# Patient Record
Sex: Female | Born: 2000 | Race: White | Hispanic: No | Marital: Single | Smoking: Former smoker
Health system: Southern US, Community
[De-identification: ages and names within clinical notes are randomized; demographics above are authoritative.]

## PROBLEM LIST (undated history)

## (undated) ENCOUNTER — Ambulatory Visit: Source: Home / Self Care

---
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
## 2019-05-19 ENCOUNTER — Other Ambulatory Visit: Payer: Self-pay

## 2019-05-19 DIAGNOSIS — Z20822 Contact with and (suspected) exposure to covid-19: Secondary | ICD-10-CM

## 2019-05-22 LAB — NOVEL CORONAVIRUS, NAA: SARS-CoV-2, NAA: NOT DETECTED

## 2019-08-03 ENCOUNTER — Ambulatory Visit: Payer: Medicaid Other | Attending: Internal Medicine

## 2019-08-03 ENCOUNTER — Other Ambulatory Visit: Payer: Medicaid Other

## 2019-08-03 DIAGNOSIS — Z20822 Contact with and (suspected) exposure to covid-19: Secondary | ICD-10-CM

## 2019-08-04 LAB — NOVEL CORONAVIRUS, NAA: SARS-CoV-2, NAA: DETECTED — AB

## 2019-08-08 ENCOUNTER — Ambulatory Visit: Payer: Self-pay | Admitting: *Deleted

## 2019-08-08 NOTE — Telephone Encounter (Signed)
Pt reports tested positive covid 08/03/2019. Reports constant chest tightness with occasional pain upper chest between breasts. States "Feels like I'm not taking a deep enough breath when I breathe in." Afebrile. Has cough.No PCP.  Advised UC. Advised to call UC of choice before going to alert them of positive status. Pt verbalizes understanding.   Reason for Disposition . Taking a deep breath makes pain worse  Answer Assessment - Initial Assessment Questions 1. LOCATION: "Where does it hurt?"      Upper chest between breasts 2. RADIATION: "Does the pain go anywhere else?" (e.g., into neck, jaw, arms, back)     no 3. ONSET: "When did the chest pain begin?" (Minutes, hours or days)       4. PATTERN "Does the pain come and go, or has it been constant since it started?"  "Does it get worse with exertion?"      Tightness constant 5. DURATION: "How long does it last" (e.g., seconds, minutes, hours)     Pain is intermittent, seconds 6. SEVERITY: "How bad is the pain?"  (e.g., Scale 1-10; mild, moderate, or severe)    - MILD (1-3): doesn't interfere with normal activities     - MODERATE (4-7): interferes with normal activities or awakens from sleep    - SEVERE (8-10): excruciating pain, unable to do any normal activities       Moderate tightness 7. CARDIAC RISK FACTORS: "Do you have any history of heart problems or risk factors for heart disease?" (e.g., angina, prior heart attack; diabetes, high blood pressure, high cholesterol, smoker, or strong family history of heart disease)      8. PULMONARY RISK FACTORS: "Do you have any history of lung disease?"  (e.g., blood clots in lung, asthma, emphysema, birth control pills)      9. CAUSE: "What do you think is causing the chest pain?" Covid positive\    10. OTHER SYMPTOMS: "Do you have any other symptoms?" (e.g., dizziness, nausea, vomiting, sweating, fever, difficulty breathing, cough)       cough  Protocols used: CHEST PAIN-A-AH

## 2019-10-21 ENCOUNTER — Ambulatory Visit: Payer: Medicaid Other | Attending: Internal Medicine

## 2019-10-21 DIAGNOSIS — Z23 Encounter for immunization: Secondary | ICD-10-CM

## 2019-10-21 NOTE — Progress Notes (Signed)
   Covid-19 Vaccination Clinic  Name:  Kelli Hunter    MRN: [id number] DOB: [birth year]  10/21/2019  [name] was observed post Covid-19 immunization for 15 minutes without incident. She was provided with Vaccine Information Sheet and instruction to access the V-Safe system.   [name] was instructed to call 911 with any severe reactions post vaccine: Marland Kitchen Difficulty breathing  . Swelling of face and throat  . A fast heartbeat  . A bad rash all over body  . Dizziness and weakness   Immunizations Administered    Name Date Dose VIS Date Route   Pfizer COVID-19 Vaccine 10/21/2019 12:24 PM 0.3 mL 08/24/2018 Intramuscular   Manufacturer: [employer], [employer]   Lot: [id number]   NDC: [address]-5

## 2019-11-14 ENCOUNTER — Ambulatory Visit: Payer: Medicaid Other | Attending: Internal Medicine

## 2019-11-14 DIAGNOSIS — Z23 Encounter for immunization: Secondary | ICD-10-CM

## 2019-11-14 NOTE — Progress Notes (Signed)
   Covid-19 Vaccination Clinic  Name:  Kelli Hunter    MRN: [id number] DOB: [birth year]  11/14/2019  Kelli Hunter was observed post Covid-19 immunization for 15 minutes without incident. She was provided with Vaccine Information Sheet and instruction to access the V-Safe system.   Kelli Hunter was instructed to call 911 with any severe reactions post vaccine: Marland Kitchen Difficulty breathing  . Swelling of face and throat  . A fast heartbeat  . A bad rash all over body  . Dizziness and weakness   Immunizations Administered    Name Date Dose VIS Date Route   Pfizer COVID-19 Vaccine 11/14/2019 12:15 PM 0.3 mL 08/24/2018 Intramuscular   Manufacturer: [employer], [employer]   Lot: [id number]   NDC: [address]-9

## 2020-11-11 ENCOUNTER — Ambulatory Visit (HOSPITAL_COMMUNITY)
Admission: EM | Admit: 2020-11-11 | Discharge: 2020-11-11 | Disposition: A | Discharge status: Home / Self Care | Payer: Medicaid Other | Attending: Medical Oncology | Admitting: Medical Oncology

## 2020-11-11 ENCOUNTER — Other Ambulatory Visit: Payer: Self-pay

## 2020-11-11 ENCOUNTER — Encounter (HOSPITAL_COMMUNITY): Payer: Self-pay

## 2020-11-11 ENCOUNTER — Ambulatory Visit (INDEPENDENT_AMBULATORY_CARE_PROVIDER_SITE_OTHER): Payer: Medicaid Other

## 2020-11-11 DIAGNOSIS — R0602 Shortness of breath: Secondary | ICD-10-CM | POA: Diagnosis not present

## 2020-11-11 DIAGNOSIS — U099 Post covid-19 condition, unspecified: Secondary | ICD-10-CM | POA: Diagnosis not present

## 2020-11-11 DIAGNOSIS — Z8616 Personal history of COVID-19: Secondary | ICD-10-CM | POA: Diagnosis not present

## 2020-11-11 NOTE — ED Triage Notes (Signed)
Pt presents with shortness of breath post covid; pt states she recovered from covid about 2 weeks ago for the 3 rd time and has some difficulties getting a full breath.

## 2020-11-11 NOTE — Discharge Instructions (Addendum)
I would talk to your PCP about a referral to a pulmonologist as you may benefit from treatment and physical therapy related to shortness of breathe secondary to COVID-19.

## 2020-11-11 NOTE — ED Provider Notes (Signed)
[facility]    CSN: [id number] Arrival date & time: 11/11/20  1414      History   Chief Complaint Chief Complaint  Patient presents with  . Shortness of Breath    HPI Kelli Hunter is a 20 y.o. female.   HPI   Shortness of breath: Patient was diagnosed with COVID 2 weeks ago for the third time.  She says progressively with each COVID case she has had more more trouble with shortness of breath.  She states that her shortness of breath has not significantly worsened during the course of this COVID outbreak.  She continues to struggle with doing things she normally used to be able to complete.  Still has having low-grade fevers off and on. Occasional chest tightness at rest but no chest pain.  She states that she has tried pretty much every medication such as steroids, inhalers, Tessalon, syrups both prescription and over-the-counter with little relief.   History reviewed. No pertinent past medical history.  There are no problems to display for this patient.   History reviewed. No pertinent surgical history.  OB History   No obstetric history on file.      Home Medications    Prior to Admission medications   Medication Sig Start Date End Date Taking? Authorizing Provider  cyclobenzaprine (FLEXERIL) 5 MG tablet Take 5 mg by mouth daily as needed for muscle spasms.    [provider]  ibuprofen (ADVIL,MOTRIN) 600 MG tablet Take 1 tablet (600 mg total) by mouth every 6 (six) hours as needed. 09/05/18   Nira Conn, MD  naproxen (NAPROSYN) 500 MG tablet Take 500 mg by mouth daily as needed for mild pain.    [provider]    Family History Family History  Family history unknown: Yes    Social History Social History   Tobacco Use  . Smoking status: Former Games developer  . Smokeless tobacco: Never Used  Vaping Use  . Vaping Use: Some days  . Substances: Nicotine  Substance Use Topics  . Alcohol use: No    Alcohol/week: 0.0 standard  drinks  . Drug use: No     Allergies   Patient has no known allergies.   Review of Systems Review of Systems  As stated above in HPI Physical Exam Triage Vital Signs ED Triage Vitals  Enc Vitals Group     BP 11/11/20 1453 129/70     Pulse Rate 11/11/20 1453 87     Resp 11/11/20 1453 17     Temp 11/11/20 1453 99.6 F (37.6 C)     Temp Source 11/11/20 1453 Oral     SpO2 11/11/20 1453 100 %     Weight --      Height --      Head Circumference --      Peak Flow --      Pain Score 11/11/20 1454 0     Pain Loc --      Pain Edu? --      Excl. in GC? --    No data found.  Updated Vital Signs BP 129/70 (BP Location: Right Arm)   Pulse 87   Temp 99.6 F (37.6 C) (Oral)   Resp 17   LMP 10/14/2020   SpO2 100%   Physical Exam Vitals and nursing note reviewed.  Constitutional:      General: She is not in acute distress.    Appearance: She is well-developed. She is not ill-appearing, toxic-appearing or diaphoretic.  Comments: Pt is able to speak in full sentences.   HENT:     Head: Normocephalic and atraumatic.     Mouth/Throat:     Mouth: Mucous membranes are moist.     Pharynx: Oropharynx is clear. No pharyngeal swelling or oropharyngeal exudate.  Eyes:     Extraocular Movements: Extraocular movements intact.     Pupils: Pupils are equal, round, and reactive to light.  Neck:     Thyroid: No thyromegaly.     Trachea: No tracheal deviation.  Cardiovascular:     Rate and Rhythm: Normal rate and regular rhythm.  Pulmonary:     Effort: Pulmonary effort is normal.     Breath sounds: Normal breath sounds. No decreased breath sounds, wheezing, rhonchi or rales.  Chest:     Chest wall: Tenderness (reproducible mid chest tightness to palpation of patient by patient ) present. No mass.  Musculoskeletal:     Cervical back: Normal range of motion and neck supple.  Skin:    General: Skin is warm.     Capillary Refill: Capillary refill takes less than 2 seconds.      Coloration: Skin is not cyanotic or pale.     Findings: No rash.  Neurological:     Mental Status: She is alert and oriented to person, place, and time.     Motor: No weakness.      UC Treatments / Results  Labs (all labs ordered are listed, but only abnormal results are displayed) Labs Reviewed - No data to display  EKG   Radiology No results found.  Procedures Procedures (including critical care time)  Medications Ordered in UC Medications - No data to display  Initial Impression / Assessment and Plan / UC Course  I have reviewed the triage vital signs and the nursing notes.  Pertinent labs & imaging results that were available during my care of the patient were reviewed by me and considered in my medical decision making (see chart for details).     New.  Chest x-ray is benign appearing.  EKG is reassuring.  Will obtain labs however I strongly suspect that this may be shortness of breath secondary to COVID-19.  I discussed with her that I have had a quite a few patients who have been seen by pulmonology following COVID-19 with having reassuring vitals, cardiac, and pulmonary work-up was the end up needing physical therapy related to shortness of breath.  Of course we always discussed that should she have any chest pain or calf pain especially with activity she will need further work-up in the emergency room to rule out PE or MI.  For her reproducible pain I have recommended naproxen twice daily x7 days.   Final Clinical Impressions(s) / UC Diagnoses   Final diagnoses:  None   Discharge Instructions   None    ED Prescriptions    None     PDMP not reviewed this encounter.   Rushie Chestnut, [place] 11/11/20 1653

## 2020-11-16 IMAGING — US US RENAL
1 series · 14 of 25 positions shown · non-contrast
Comparison: None.

CLINICAL DATA: Evaluate for kidney stone.

EXAM:
RENAL / URINARY TRACT ULTRASOUND COMPLETE

[Series 1: us renal · 14 of 30 slices shown]
[im 1/30]
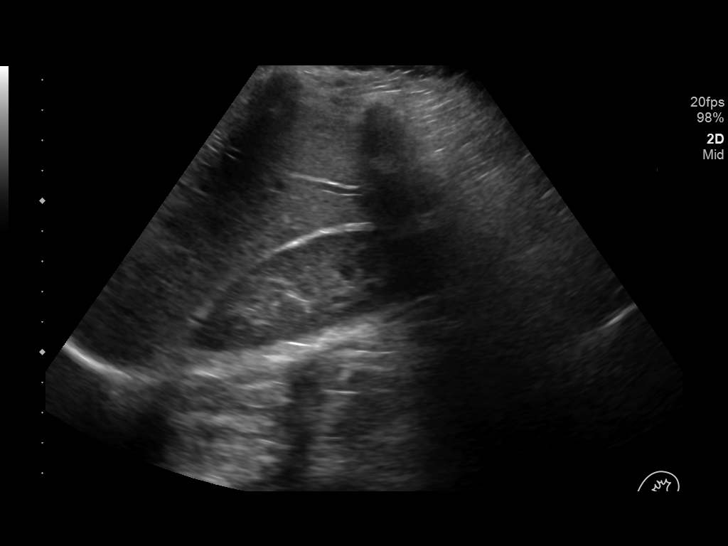
[im 3/30]
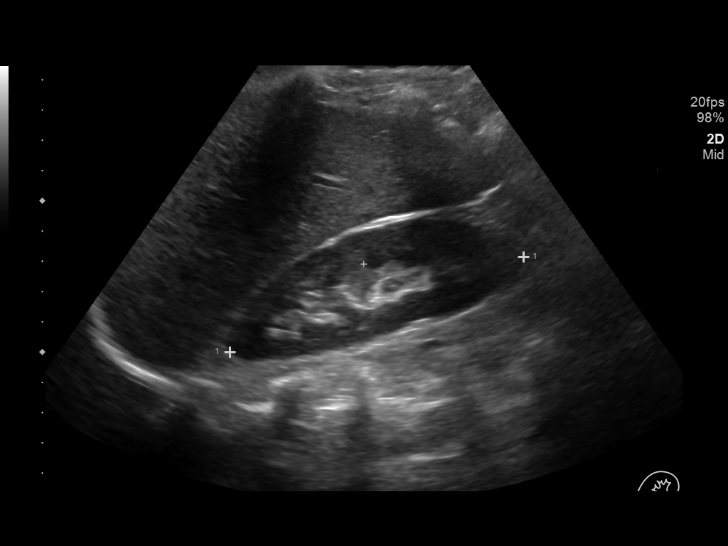
[im 5/30]
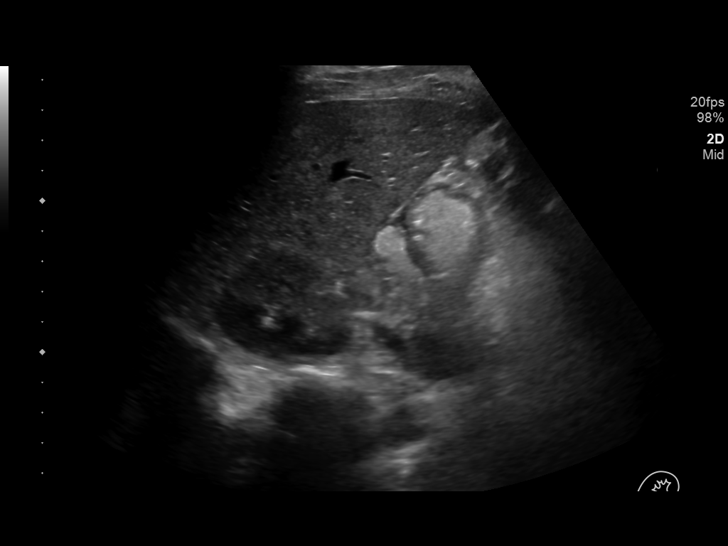
[im 8/30]
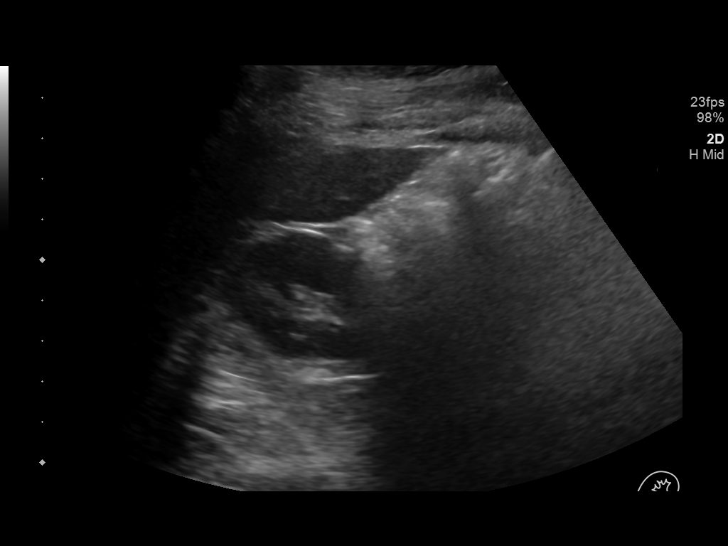
[im 10/30]
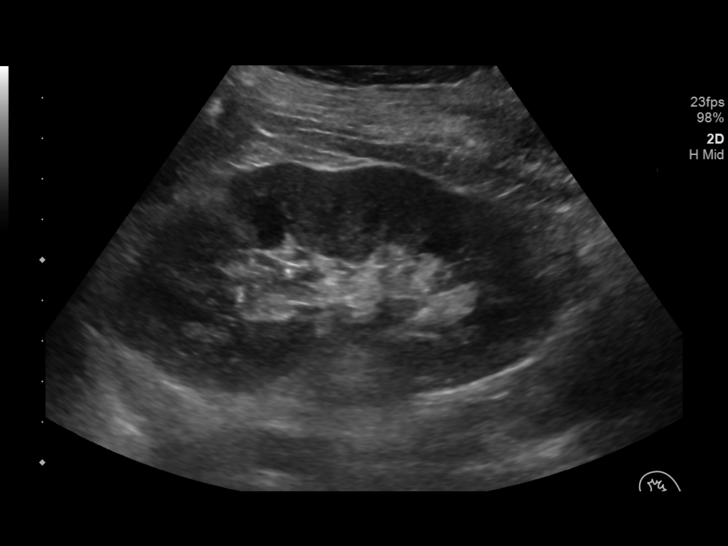
[im 11/30]
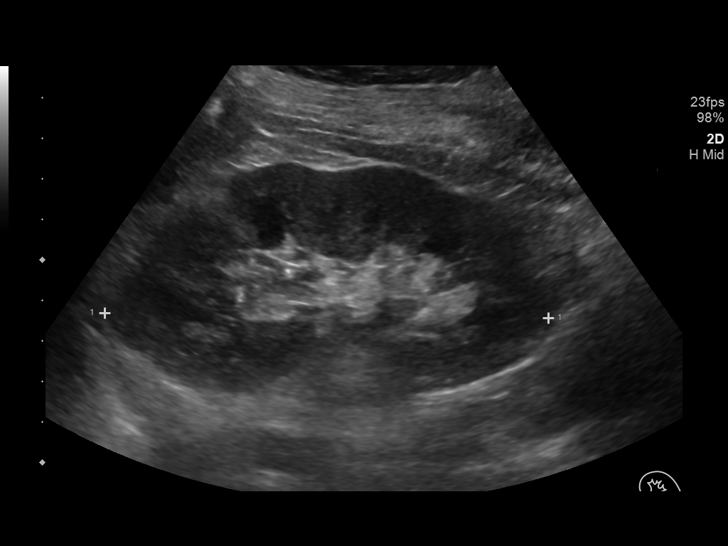
[im 14/30]
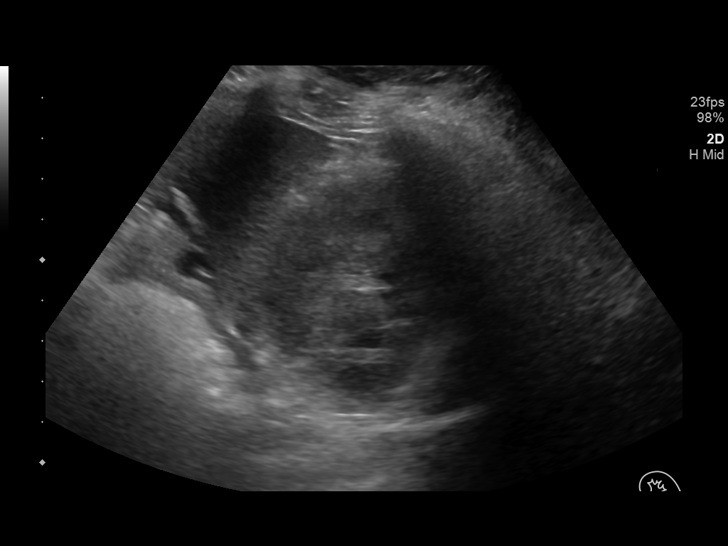
[im 16/30]
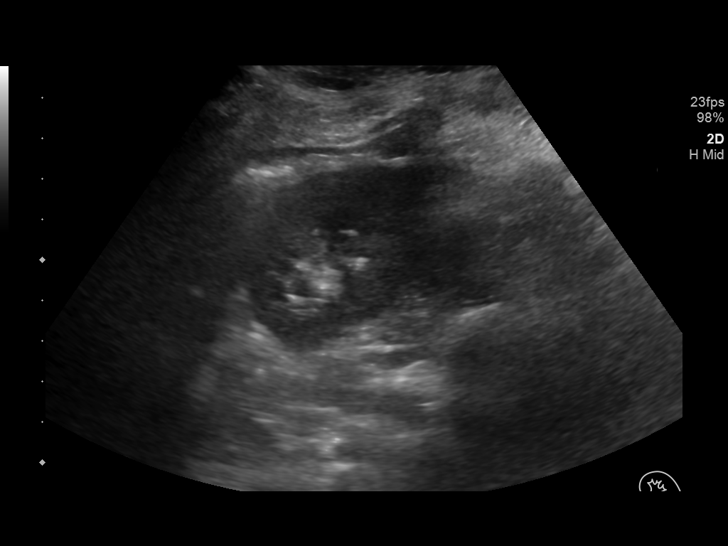
[im 19/30]
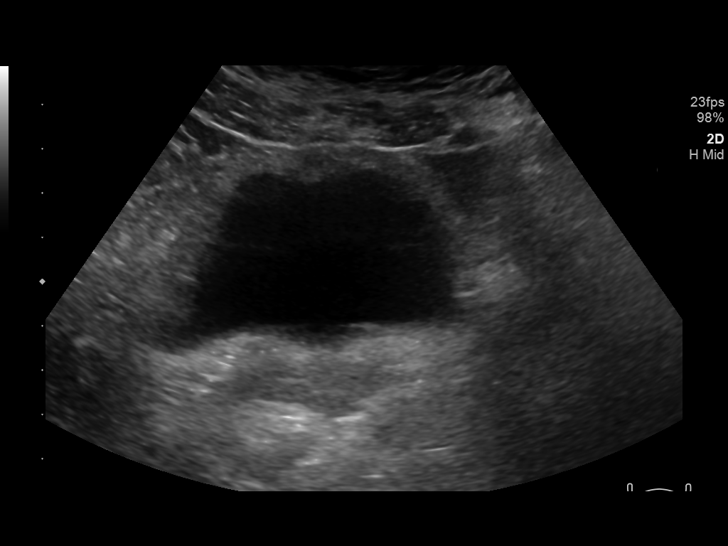
[im 20/30]
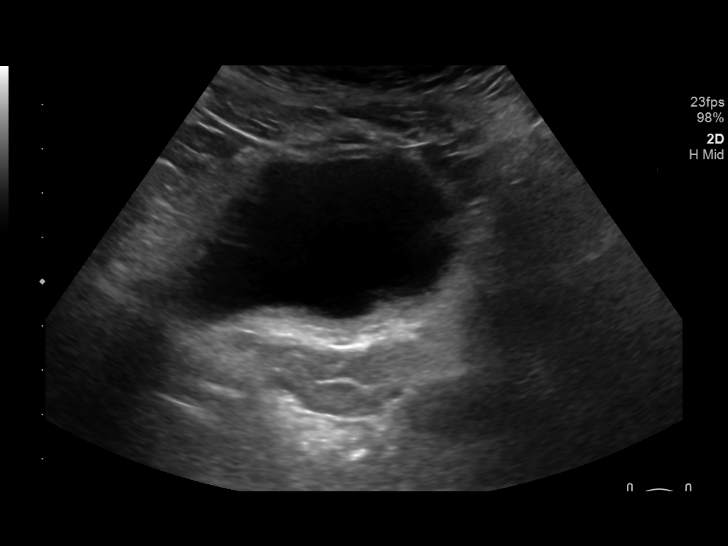
[im 22/30]
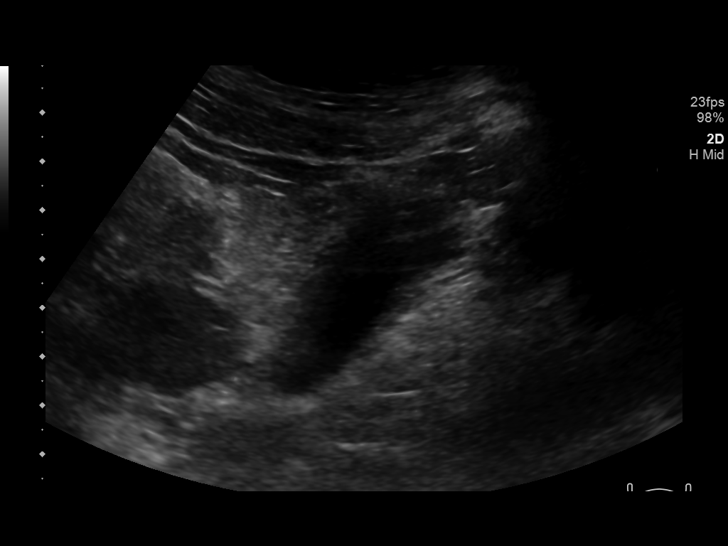
[im 25/30]
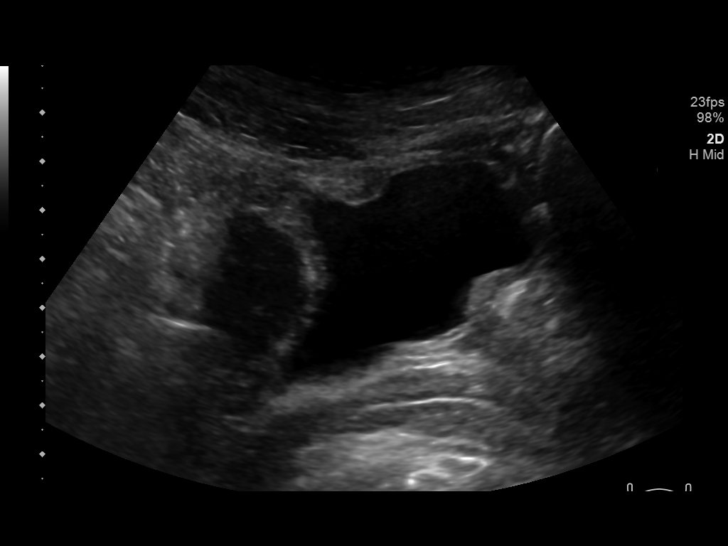
[im 27/30]
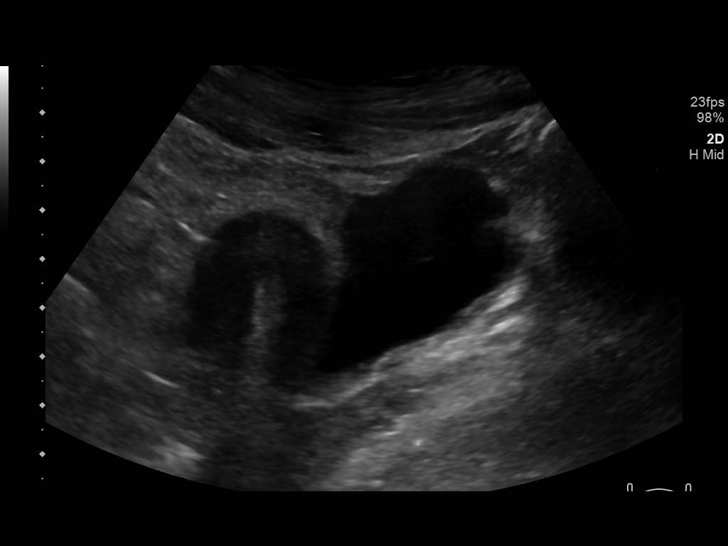
[im 30/30]
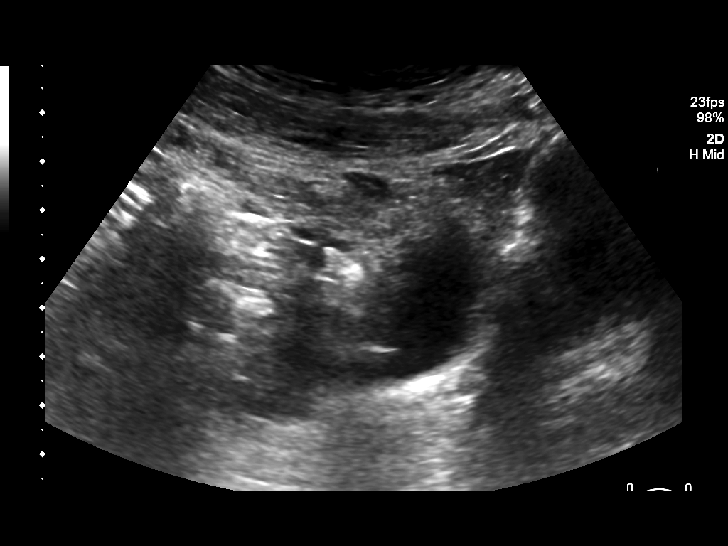

[14 of 25 positions shown; findings below may reference images not displayed]

FINDINGS: Right Kidney:

Renal measurements: 10.2 x 4.2 x 3.8 cm = volume: 85 mL .
Echogenicity within normal limits. No mass or hydronephrosis
visualized.

Left Kidney:

Renal measurements: 11 x 4.3 x 4.6 cm = volume: 112 mL. Echogenicity
within normal limits. No mass or hydronephrosis visualized.

Bladder:

Appears normal for degree of bladder distention.
IMPRESSION: Normal renal ultrasound.

## 2022-01-14 ENCOUNTER — Ambulatory Visit
Admission: EM | Admit: 2022-01-14 | Discharge: 2022-01-14 | Disposition: A | Discharge status: Home / Self Care | Payer: Medicaid Other | Attending: Physician Assistant | Admitting: Physician Assistant

## 2022-01-14 ENCOUNTER — Encounter: Payer: Self-pay | Admitting: Emergency Medicine

## 2022-01-14 DIAGNOSIS — R112 Nausea with vomiting, unspecified: Secondary | ICD-10-CM | POA: Diagnosis not present

## 2022-01-14 LAB — POCT URINALYSIS DIP (MANUAL ENTRY)
Blood, UA: NEGATIVE
Glucose, UA: NEGATIVE mg/dL
Nitrite, UA: NEGATIVE
Protein Ur, POC: NEGATIVE mg/dL
Spec Grav, UA: 1.025 (ref 1.010–1.025)
Urobilinogen, UA: 0.2 E.U./dL
pH, UA: 6 (ref 5.0–8.0)

## 2022-01-14 LAB — POCT URINE PREGNANCY: Preg Test, Ur: NEGATIVE

## 2022-01-14 MED ORDER — ONDANSETRON 4 MG PO TBDP
4.0000 mg | ORAL_TABLET | Freq: Once | ORAL | Status: AC
  Administered 2022-01-14: 4 mg via ORAL

## 2022-01-14 MED ORDER — ONDANSETRON 4 MG PO TBDP: 4.0000 mg | ORAL_TABLET | Freq: Three times a day (TID) | ORAL | 0 refills | Status: DC | PRN

## 2022-01-14 NOTE — Discharge Instructions (Addendum)
Drink plenty of fluids

## 2022-01-14 NOTE — ED Triage Notes (Signed)
Pt is present today with c/o nausea and vomiting.  Pt states that her sx started Saturday  Pt states that she took a pregnancy test yesterday and it was negative.  Pt LMP 11/29/2021. Pt states that she has irregular periods.

## 2022-01-15 ENCOUNTER — Other Ambulatory Visit: Payer: Self-pay

## 2022-01-15 ENCOUNTER — Encounter (HOSPITAL_BASED_OUTPATIENT_CLINIC_OR_DEPARTMENT_OTHER): Payer: Self-pay

## 2022-01-15 DIAGNOSIS — R55 Syncope and collapse: Secondary | ICD-10-CM | POA: Diagnosis not present

## 2022-01-15 DIAGNOSIS — Z87891 Personal history of nicotine dependence: Secondary | ICD-10-CM | POA: Insufficient documentation

## 2022-01-15 DIAGNOSIS — R112 Nausea with vomiting, unspecified: Secondary | ICD-10-CM | POA: Insufficient documentation

## 2022-01-15 LAB — COMPREHENSIVE METABOLIC PANEL
ALT: 12 U/L (ref 0–44)
AST: 16 U/L (ref 15–41)
Albumin: 5 g/dL (ref 3.5–5.0)
Alkaline Phosphatase: 83 U/L (ref 38–126)
Anion gap: 12 (ref 5–15)
BUN: 8 mg/dL (ref 6–20)
CO2: 27 mmol/L (ref 22–32)
Calcium: 10.5 mg/dL — ABNORMAL HIGH (ref 8.9–10.3)
Chloride: 100 mmol/L (ref 98–111)
Creatinine, Ser: 0.71 mg/dL (ref 0.44–1.00)
GFR, Estimated: >60 mL/min (ref >60)
Glucose, Bld: 81 mg/dL (ref 70–99)
Potassium: 3.9 mmol/L (ref 3.5–5.1)
Sodium: 139 mmol/L (ref 135–145)
Total Bilirubin: 0.5 mg/dL (ref 0.3–1.2)
Total Protein: 8.4 g/dL — ABNORMAL HIGH (ref 6.5–8.1)

## 2022-01-15 LAB — URINALYSIS, ROUTINE W REFLEX MICROSCOPIC
Bilirubin Urine: NEGATIVE
Glucose, UA: NEGATIVE mg/dL
Ketones, ur: 40 mg/dL — AB
Leukocytes,Ua: NEGATIVE
Nitrite: NEGATIVE
Protein, ur: 30 mg/dL — AB
RBC / HPF: >50 RBC/hpf — ABNORMAL HIGH (ref 0–5)
Specific Gravity, Urine: 1.02 (ref 1.005–1.030)
pH: 6 (ref 5.0–8.0)

## 2022-01-15 LAB — CBC
HCT: 41.6 % (ref 36.0–46.0)
Hemoglobin: 14.2 g/dL (ref 12.0–15.0)
MCH: 30.3 pg (ref 26.0–34.0)
MCHC: 34.1 g/dL (ref 30.0–36.0)
MCV: 88.9 fL (ref 80.0–100.0)
Platelets: 359 10*3/uL (ref 150–400)
RBC: 4.68 MIL/uL (ref 3.87–5.11)
RDW: 11.7 % (ref 11.5–15.5)
WBC: 11.2 10*3/uL — ABNORMAL HIGH (ref 4.0–10.5)
nRBC: 0 % (ref 0.0–0.2)

## 2022-01-15 LAB — LIPASE, BLOOD: Lipase: 11 U/L (ref 11–51)

## 2022-01-15 LAB — PREGNANCY, URINE: Preg Test, Ur: NEGATIVE

## 2022-01-15 NOTE — ED Triage Notes (Signed)
Patient here POV from Home.  Endorses N/V since Saturday. Chills. Mild ABD Pain and Back Pain. Mild Dysuria.   No Diarrhea but Loose Stools. Temperature of 100 at Home.   NAD Noted during Triage. A&Ox4. GCS 15. Ambulatory.

## 2022-01-16 ENCOUNTER — Emergency Department (HOSPITAL_BASED_OUTPATIENT_CLINIC_OR_DEPARTMENT_OTHER)
Admission: EM | Admit: 2022-01-16 | Discharge: 2022-01-16 | Disposition: A | Discharge status: Home / Self Care | Payer: Medicaid Other | Attending: Emergency Medicine | Admitting: Emergency Medicine

## 2022-01-16 DIAGNOSIS — R112 Nausea with vomiting, unspecified: Secondary | ICD-10-CM

## 2022-01-16 DIAGNOSIS — R55 Syncope and collapse: Secondary | ICD-10-CM

## 2022-01-16 MED ORDER — SODIUM CHLORIDE 0.9 % IV BOLUS
1000.0000 mL | Freq: Once | INTRAVENOUS | Status: AC
  Administered 2022-01-16: 1000 mL via INTRAVENOUS

## 2022-01-16 MED ORDER — ONDANSETRON HCL 4 MG/2ML IJ SOLN
INTRAMUSCULAR | Status: AC
  Administered 2022-01-16: 4 mg
  Filled 2022-01-16: qty 2

## 2022-01-16 MED ORDER — ONDANSETRON 4 MG PO TBDP: 4.0000 mg | ORAL_TABLET | Freq: Three times a day (TID) | ORAL | 0 refills | Status: DC | PRN

## 2022-01-16 NOTE — Discharge Instructions (Signed)
You were evaluated in the Emergency Department and after careful evaluation, we did not find any emergent condition requiring admission or further testing in the hospital.  Your exam/testing today is overall reassuring.  Symptoms likely due to a viral illness causing dehydration, leading to a fainting spell.  Use the Zofran as needed for nausea, drink plenty of fluids over the next few days and get some rest.  Please return to the Emergency Department if you experience any worsening of your condition.   Thank you for allowing [place] to be a part of your care.

## 2022-01-16 NOTE — ED Notes (Signed)
ED Provider at bedside. 

## 2022-01-16 NOTE — ED Notes (Signed)
Pt agreeable with d/c plan as discussed by provider- this nurse has verbally reinforced d/c instructions and provided pt with written copy- pt acknowledges verbal understanding and denies any additional questions, concerns, needs- ambulatory independently at d/c with steady gait; no distress; vitals stable.  

## 2022-01-16 NOTE — ED Provider Notes (Signed)
[facility] Emergency Department Provider Note MRN:  [id number]  Arrival date & time: 01/16/22     Chief Complaint   Emesis   History of Present Illness   Kelli Hunter is a 21 y.o. year-old female with no pertinent past medical history presenting to the ED with chief complaint of emesis.  Nausea vomiting and loose stool for the past 3 to 4 days.  Today was out in the heat helping her mother and then became lightheaded and passed out.  Denies chest pain, no abdominal pain, no shortness of breath, no headache, feels dehydrated.  Review of Systems  A thorough review of systems was obtained and all systems are negative except as noted in the HPI and PMH.   Patient's Health History   History reviewed. No pertinent past medical history.  History reviewed. No pertinent surgical history.  Family History  Family history unknown: Yes    Social History   Socioeconomic History   Marital status: Single    Spouse name: Not on file   Number of children: Not on file   Years of education: Not on file   Highest education level: Not on file  Occupational History   Not on file  Tobacco Use   Smoking status: Former   Smokeless tobacco: Never  Vaping Use   Vaping Use: Some days   Substances: Nicotine  Substance and Sexual Activity   Alcohol use: No    Alcohol/week: 0.0 standard drinks of alcohol   Drug use: No   Sexual activity: Never  Other Topics Concern   Not on file  Social History Narrative   Not on file   Social Determinants of Health   Financial Resource Strain: Not on file  Food Insecurity: Not on file  Transportation Needs: Not on file  Physical Activity: Not on file  Stress: Not on file  Social Connections: Not on file  Intimate Partner Violence: Not on file     Physical Exam   Vitals:   01/16/22 0200 01/16/22 0230  BP: 106/88 104/73  Pulse: 72 84  Resp: 18 (!) 22  Temp:    SpO2: 100% 95%    CONSTITUTIONAL: Well-appearing,  NAD NEURO/PSYCH:  Alert and oriented x 3, no focal deficits EYES:  eyes equal and reactive ENT/NECK:  no LAD, no JVD CARDIO: Regular rate, well-perfused, normal S1 and S2 PULM:  CTAB no wheezing or rhonchi GI/GU:  non-distended, non-tender MSK/SPINE:  No gross deformities, no edema SKIN:  no rash, atraumatic   *Additional and/or pertinent findings included in MDM below  Diagnostic and Interventional Summary    EKG Interpretation  Date/Time:  January 16, 2022 at 01:21:12 Ventricular Rate:   74 PR Interval:   176 QRS Duration:  82 QT Interval: 367   QTC Calculation:408   R Axis:     Text Interpretation: Sinus rhythm       Labs Reviewed  COMPREHENSIVE METABOLIC PANEL - Abnormal; Notable for the following components:      Result Value   Calcium 10.5 (*)    Total Protein 8.4 (*)    All other components within normal limits  CBC - Abnormal; Notable for the following components:   WBC 11.2 (*)    All other components within normal limits  URINALYSIS, ROUTINE W REFLEX MICROSCOPIC - Abnormal; Notable for the following components:   APPearance HAZY (*)    Hgb urine dipstick LARGE (*)    Ketones, ur 40 (*)    Protein, ur 30 (*)  RBC / HPF >50 (*)    Bacteria, UA RARE (*)    All other components within normal limits  LIPASE, BLOOD  PREGNANCY, URINE    No orders to display    Medications  ondansetron (ZOFRAN) 4 MG/2ML injection (4 mg  Given 01/16/22 0141)  sodium chloride 0.9 % bolus 1,000 mL (1,000 mLs Intravenous New Bag/Given 01/16/22 0144)     Procedures  /  Critical Care Procedures  ED Course and Medical Decision Making  Initial Impression and Ddx Favoring viral gastroenteritis with dehydration causing benign syncopal event.  Reassuring vital signs, awaiting labs, EKG, providing fluids.  Past medical/surgical history that increases complexity of ED encounter: None  Interpretation of Diagnostics I personally reviewed the EKG and my interpretation is as follows:  Sinus rhythm, normal intervals  Labs are reassuring with no significant blood count or electrolyte disturbance.  Hematuria noted, patient is on her period.  Patient Reassessment and Ultimate Disposition/Management     Patient feeling well on reassessment, vital signs remain normal, benign abdomen, feels well enough to go home.  Patient management required discussion with the following services or consulting groups:  None  Complexity of Problems Addressed Acute illness or injury that poses threat of life of bodily function  Additional Data Reviewed and Analyzed Further history obtained from: Further history from spouse/family member  Additional Factors Impacting ED Encounter Risk Prescriptions  Elmer Sow. Pilar Plate, MD [facility] Emergency Medicine [facility] [email or web address]  Final Clinical Impressions(s) / ED Diagnoses     ICD-10-CM   1. Nausea and vomiting, unspecified vomiting type  R11.2     2. Syncope, unspecified syncope type  R55       ED Discharge Orders          Ordered    ondansetron (ZOFRAN-ODT) 4 MG disintegrating tablet  Every 8 hours PRN        01/16/22 0242             Discharge Instructions Discussed with and Provided to Patient:     Discharge Instructions      You were evaluated in the Emergency Department and after careful evaluation, we did not find any emergent condition requiring admission or further testing in the hospital.  Your exam/testing today is overall reassuring.  Symptoms likely due to a viral illness causing dehydration, leading to a fainting spell.  Use the Zofran as needed for nausea, drink plenty of fluids over the next few days and get some rest.  Please return to the Emergency Department if you experience any worsening of your condition.   Thank you for allowing [place] to be a part of your care.       Sabas Sous, MD 01/16/22 [phone]

## 2022-01-17 NOTE — ED Provider Notes (Signed)
[facility]    CSN: [id number] Arrival date & time: 01/14/22  1112      History   Chief Complaint Chief Complaint  Patient presents with   Nausea    Entered by patient   Emesis    HPI Kelli Hunter is a 21 y.o. female.   Patient complains of nausea and vomiting for past 3 days  The history is provided by the patient. No language interpreter was used.  Emesis Severity:  Moderate Duration:  3 days Timing:  Constant Able to tolerate:  Liquids Chronicity:  New Worsened by:  Nothing Ineffective treatments:  None tried Associated symptoms: no abdominal pain   Risk factors: no alcohol use and not pregnant     History reviewed. No pertinent past medical history.  There are no problems to display for this patient.   History reviewed. No pertinent surgical history.  OB History   No obstetric history on file.      Home Medications    Prior to Admission medications   Medication Sig Start Date End Date Taking? Authorizing Provider  ondansetron (ZOFRAN-ODT) 4 MG disintegrating tablet Take 1 tablet (4 mg total) by mouth every 8 (eight) hours as needed for nausea or vomiting. 01/14/22  Yes Cheron Schaumann K, PA-C  cyclobenzaprine (FLEXERIL) 5 MG tablet Take 5 mg by mouth daily as needed for muscle spasms.    [provider]  ibuprofen (ADVIL,MOTRIN) 600 MG tablet Take 1 tablet (600 mg total) by mouth every 6 (six) hours as needed. 09/05/18   Nira Conn, MD  naproxen (NAPROSYN) 500 MG tablet Take 500 mg by mouth daily as needed for mild pain.    [provider]  ondansetron (ZOFRAN-ODT) 4 MG disintegrating tablet Take 1 tablet (4 mg total) by mouth every 8 (eight) hours as needed for nausea or vomiting. 01/16/22   Sabas Sous, MD    Family History Family History  Family history unknown: Yes    Social History Social History   Tobacco Use   Smoking status: Former   Smokeless tobacco: Never  Building services engineer Use: Some days    Substances: Nicotine  Substance Use Topics   Alcohol use: No    Alcohol/week: 0.0 standard drinks of alcohol   Drug use: No     Allergies   Patient has no known allergies.   Review of Systems Review of Systems  Gastrointestinal:  Positive for vomiting. Negative for abdominal pain.  All other systems reviewed and are negative.    Physical Exam Triage Vital Signs ED Triage Vitals  Enc Vitals Group     BP 01/14/22 1145 108/77     Pulse Rate 01/14/22 1145 98     Resp 01/14/22 1145 18     Temp 01/14/22 1145 98.2 F (36.8 C)     Temp src --      SpO2 01/14/22 1145 98 %     Weight --      Height --      Head Circumference --      Peak Flow --      Pain Score 01/14/22 1144 0     Pain Loc --      Pain Edu? --      Excl. in GC? --    No data found.  Updated Vital Signs BP 108/77   Pulse 98   Temp 98.2 F (36.8 C)   Resp 18   LMP 11/29/2021   SpO2 98%  Visual Acuity Right Eye Distance:   Left Eye Distance:   Bilateral Distance:    Right Eye Near:   Left Eye Near:    Bilateral Near:     Physical Exam Vitals and nursing note reviewed.  Constitutional:      Appearance: She is well-developed.  HENT:     Head: Normocephalic.     Mouth/Throat:     Mouth: Mucous membranes are moist.  Eyes:     Extraocular Movements: Extraocular movements intact.     Pupils: Pupils are equal, round, and reactive to light.  Cardiovascular:     Rate and Rhythm: Normal rate.  Pulmonary:     Effort: Pulmonary effort is normal.  Abdominal:     General: There is no distension.  Musculoskeletal:        General: Normal range of motion.     Cervical back: Normal range of motion.  Skin:    General: Skin is warm.  Neurological:     General: No focal deficit present.     Mental Status: She is alert and oriented to person, place, and time.  Psychiatric:        Mood and Affect: Mood normal.      UC Treatments / Results  Labs (all labs ordered are listed, but only  abnormal results are displayed) Labs Reviewed  POCT URINALYSIS DIP (MANUAL ENTRY) - Abnormal; Notable for the following components:      Result Value   Bilirubin, UA small (*)    Ketones, POC UA small (15) (*)    Leukocytes, UA Trace (*)    All other components within normal limits  POCT URINE PREGNANCY    EKG   Radiology No results found.  Procedures Procedures (including critical care time)  Medications Ordered in UC Medications  ondansetron (ZOFRAN-ODT) disintegrating tablet 4 mg (4 mg Oral Given 01/14/22 1220)    Initial Impression / Assessment and Plan / UC Course  I have reviewed the triage vital signs and the nursing notes.  Pertinent labs & imaging results that were available during my care of the patient were reviewed by me and considered in my medical decision making (see chart for details).     MDM I suspect viral illness.  Patient given a prescription for Zofran patient advised to recheck if symptoms persist more than 24 hours Final Clinical Impressions(s) / UC Diagnoses   Final diagnoses:  Nausea and vomiting, unspecified vomiting type     Discharge Instructions      Drink plenty of fluids    ED Prescriptions     Medication Sig Dispense Auth. Provider   ondansetron (ZOFRAN-ODT) 4 MG disintegrating tablet Take 1 tablet (4 mg total) by mouth every 8 (eight) hours as needed for nausea or vomiting. 10 tablet Elson Areas, [place]      PDMP not reviewed this encounter. An After Visit Summary was printed and given to the patient.    Elson Areas, [place] 01/17/22 1148

## 2022-03-21 ENCOUNTER — Ambulatory Visit
Admission: RE | Admit: 2022-03-21 | Discharge: 2022-03-21 | Disposition: A | Discharge status: Home / Self Care | Payer: Medicaid Other | Source: Ambulatory Visit | Attending: Emergency Medicine | Admitting: Emergency Medicine

## 2022-03-21 VITALS — BP 117/77 | HR 81 | Temp 98.0°F | Resp 16

## 2022-03-21 DIAGNOSIS — J00 Acute nasopharyngitis [common cold]: Secondary | ICD-10-CM | POA: Diagnosis not present

## 2022-03-21 MED ORDER — FLUTICASONE PROPIONATE 50 MCG/ACT NA SUSP: 1.0000 | Freq: Every day | NASAL | 2 refills | Status: DC

## 2022-03-21 NOTE — ED Triage Notes (Signed)
Pt c/o chest tightness, cough, low grade fever and nasal congestion.  Started: 2 days ago   Home interventions: motrin, hydration

## 2022-03-21 NOTE — ED Provider Notes (Signed)
[facility]    CSN: [id number] Arrival date & time: 03/21/22  1048    HISTORY   Chief Complaint  Patient presents with   Wheezing    Feels very difficult for me to breathe having tightness/aches in my chest and back around my lungs - Entered by patient   Cough   HPI Kelli Hunter is a pleasant, 21 y.o. female who presents to urgent care today. Patient complains of tightness in her chest upon waking in the morning, nonproductive cough that is not interfering with her sleep, low-grade fever, a little bit of nasal congestion and feeling "blah" yesterday.  Patient states her symptoms began 2 days ago.  Patient states she has been trying to drink more water and take Motrin but has had no relief of her complaints.  Patient reports a negative COVID-19 test yesterday.  Patient has been seen multiple times over the past several years for similar complaints.  Patient has never been diagnosed with allergies or asthma.  Patient is also complained of multiple episodes of COVID-19 adding that every time she has COVID-19 she is more sick than the previous episode.  Patient has normal vital signs on arrival today.  The history is provided by the patient.   History reviewed. No pertinent past medical history. There are no problems to display for this patient.  History reviewed. No pertinent surgical history. OB History   No obstetric history on file.    Home Medications    Prior to Admission medications   Medication Sig Start Date End Date Taking? Authorizing Provider    Family History Family History  Family history unknown: Yes   Social History Social History   Tobacco Use   Smoking status: Former   Smokeless tobacco: Never  Scientific laboratory technician Use: Some days   Substances: Nicotine  Substance Use Topics   Alcohol use: No    Alcohol/week: 0.0 standard drinks of alcohol   Drug use: No   Allergies   Patient has no known allergies.  Review of Systems Review of  Systems Pertinent findings revealed after performing a 14 point review of systems has been noted in the history of present illness.  Physical Exam Triage Vital Signs ED Triage Vitals  Enc Vitals Group     BP 04/26/21 0827 (!) 147/82     Pulse Rate 04/26/21 0827 72     Resp 04/26/21 0827 18     Temp 04/26/21 0827 98.3 F (36.8 C)     Temp Source 04/26/21 0827 Oral     SpO2 04/26/21 0827 98 %     Weight --      Height --      Head Circumference --      Peak Flow --      Pain Score 04/26/21 0826 5     Pain Loc --      Pain Edu? --      Excl. in [place]? --   No data found.  Updated Vital Signs BP 117/77 (BP Location: Right Arm)   Pulse 81   Temp 98 F (36.7 C) (Oral)   Resp 16   LMP 03/20/2022   SpO2 98%   Physical Exam Vitals and nursing note reviewed.  Constitutional:      General: She is not in acute distress.    Appearance: Normal appearance. She is not ill-appearing.  HENT:     Head: Normocephalic and atraumatic.     Salivary Glands: Right salivary gland is not  diffusely enlarged or tender. Left salivary gland is not diffusely enlarged or tender.     Right Ear: Tympanic membrane, ear canal and external ear normal. No drainage. No middle ear effusion. There is no impacted cerumen. Tympanic membrane is not erythematous or bulging.     Left Ear: Tympanic membrane, ear canal and external ear normal. No drainage.  No middle ear effusion. There is no impacted cerumen. Tympanic membrane is not erythematous or bulging.     Nose: Nose normal. No nasal deformity, septal deviation, mucosal edema, congestion or rhinorrhea.     Right Turbinates: Not enlarged, swollen or pale.     Left Turbinates: Not enlarged, swollen or pale.     Right Sinus: No maxillary sinus tenderness or frontal sinus tenderness.     Left Sinus: No maxillary sinus tenderness or frontal sinus tenderness.     Mouth/Throat:     Lips: Pink. No lesions.     Mouth: Mucous membranes are moist. No oral lesions.      Pharynx: Oropharynx is clear. Uvula midline. No posterior oropharyngeal erythema or uvula swelling.     Tonsils: No tonsillar exudate. 0 on the right. 0 on the left.  Eyes:     General: Lids are normal.        Right eye: No discharge.        Left eye: No discharge.     Extraocular Movements: Extraocular movements intact.     Conjunctiva/sclera: Conjunctivae normal.     Right eye: Right conjunctiva is not injected.     Left eye: Left conjunctiva is not injected.  Neck:     Trachea: Trachea and phonation normal.  Cardiovascular:     Rate and Rhythm: Normal rate and regular rhythm.     Pulses: Normal pulses.     Heart sounds: Normal heart sounds. No murmur heard.    No friction rub. No gallop.  Pulmonary:     Effort: Pulmonary effort is normal. No accessory muscle usage, prolonged expiration or respiratory distress.     Breath sounds: Normal breath sounds. No stridor, decreased air movement or transmitted upper airway sounds. No decreased breath sounds, wheezing, rhonchi or rales.  Chest:     Chest wall: No tenderness.  Musculoskeletal:        General: Normal range of motion.     Cervical back: Normal range of motion and neck supple. Normal range of motion.  Lymphadenopathy:     Cervical: No cervical adenopathy.  Skin:    General: Skin is warm and dry.     Findings: No erythema or rash.  Neurological:     General: No focal deficit present.     Mental Status: She is alert and oriented to person, place, and time.  Psychiatric:        Mood and Affect: Mood normal.        Behavior: Behavior normal.     Visual Acuity Right Eye Distance:   Left Eye Distance:   Bilateral Distance:    Right Eye Near:   Left Eye Near:    Bilateral Near:     UC Couse / Diagnostics / Procedures:     Radiology No results found.  Procedures Procedures (including critical care time) EKG  Pending results:  Labs Reviewed - No data to display  Medications Ordered in UC: Medications - No data  to display  UC Diagnoses / Final Clinical Impressions(s)   I have reviewed the triage vital signs and the nursing notes.  Pertinent  labs & imaging results that were available during my care of the patient were reviewed by me and considered in my medical decision making (see chart for details).    Final diagnoses:  Acute rhinitis   Physical exam is unremarkable today.  Patient advised she can certainly try nasal steroid to help dry up her little bit of nasal congestion.  Patient politely declined offer for COVID-19 testing today, states she plans to take 1 at home tomorrow.  Return precautions advised.  ED Prescriptions     Medication Sig Dispense Auth. Provider   fluticasone (FLONASE) 50 MCG/ACT nasal spray Place 1 spray into both nostrils daily. Begin by using 2 sprays in each nare daily for 3 to 5 days, then decrease to 1 spray in each nare daily. 15.8 mL Lynden Oxford Scales, PA-C      PDMP not reviewed this encounter.  Disposition Upon Discharge:  Condition: stable for discharge home Home: take medications as prescribed; routine discharge instructions as discussed; follow up as advised.  Patient presented with an acute illness with associated systemic symptoms and significant discomfort requiring urgent management. In my opinion, this is a condition that a prudent lay person (someone who possesses an average knowledge of health and medicine) may potentially expect to result in complications if not addressed urgently such as respiratory distress, impairment of bodily function or dysfunction of bodily organs.   Routine symptom specific, illness specific and/or disease specific instructions were discussed with the patient and/or caregiver at length.   As such, the patient has been evaluated and assessed, work-up was performed and treatment was provided in alignment with urgent care protocols and evidence based medicine.  Patient/parent/caregiver has been advised that the patient may  require follow up for further testing and treatment if the symptoms continue in spite of treatment, as clinically indicated and appropriate.  If the patient was tested for COVID-19, Influenza and/or RSV, then the patient/parent/guardian was advised to isolate at home pending the results of his/her diagnostic coronavirus test and potentially longer if they're positive. I have also advised pt that if his/her COVID-19 test returns positive, it's recommended to self-isolate for at least 10 days after symptoms first appeared AND until fever-free for 24 hours without fever reducer AND other symptoms have improved or resolved. Discussed self-isolation recommendations as well as instructions for household member/close contacts as per the [facility] and [place] DHHS, and also gave patient the [place] packet with this information.  Patient/parent/caregiver has been advised to return to the [facility] or PCP in 3-5 days if no better; to PCP or the Emergency Department if new signs and symptoms develop, or if the current signs or symptoms continue to change or worsen for further workup, evaluation and treatment as clinically indicated and appropriate  The patient will follow up with their current PCP if and as advised. If the patient does not currently have a PCP we will assist them in obtaining one.   The patient may need specialty follow up if the symptoms continue, in spite of conservative treatment and management, for further workup, evaluation, consultation and treatment as clinically indicated and appropriate.  Patient/parent/caregiver verbalized understanding and agreement of plan as discussed.  All questions were addressed during visit.  Please see discharge instructions below for further details of plan.  Discharge Instructions:   Discharge Instructions      You are welcome to try Flonase for your nasal congestion, have sent a prescription to your pharmacy.      This office note has  been dictated using [employer].  Unfortunately, this method of dictation can sometimes lead to typographical or grammatical errors.  I apologize for your inconvenience in advance if this occurs.  Please do not hesitate to reach out to me if clarification is needed.      Lynden Oxford Scales, PA-C 03/21/22 1147

## 2022-03-21 NOTE — Discharge Instructions (Signed)
You are welcome to try Flonase for your nasal congestion, have sent a prescription to your pharmacy.

## 2022-04-16 ENCOUNTER — Encounter (HOSPITAL_BASED_OUTPATIENT_CLINIC_OR_DEPARTMENT_OTHER): Payer: Self-pay

## 2022-04-16 ENCOUNTER — Other Ambulatory Visit (HOSPITAL_BASED_OUTPATIENT_CLINIC_OR_DEPARTMENT_OTHER): Payer: Self-pay

## 2022-04-16 ENCOUNTER — Other Ambulatory Visit: Payer: Self-pay

## 2022-04-16 ENCOUNTER — Emergency Department (HOSPITAL_BASED_OUTPATIENT_CLINIC_OR_DEPARTMENT_OTHER)
Admission: EM | Admit: 2022-04-16 | Discharge: 2022-04-16 | Disposition: A | Discharge status: Home / Self Care | Payer: Medicaid Other | Attending: Emergency Medicine | Admitting: Emergency Medicine

## 2022-04-16 ENCOUNTER — Emergency Department (HOSPITAL_BASED_OUTPATIENT_CLINIC_OR_DEPARTMENT_OTHER): Payer: Medicaid Other | Admitting: Radiology

## 2022-04-16 DIAGNOSIS — R509 Fever, unspecified: Secondary | ICD-10-CM | POA: Insufficient documentation

## 2022-04-16 DIAGNOSIS — R112 Nausea with vomiting, unspecified: Secondary | ICD-10-CM | POA: Insufficient documentation

## 2022-04-16 DIAGNOSIS — Z20822 Contact with and (suspected) exposure to covid-19: Secondary | ICD-10-CM | POA: Insufficient documentation

## 2022-04-16 DIAGNOSIS — M545 Low back pain, unspecified: Secondary | ICD-10-CM | POA: Insufficient documentation

## 2022-04-16 DIAGNOSIS — R197 Diarrhea, unspecified: Secondary | ICD-10-CM | POA: Diagnosis not present

## 2022-04-16 DIAGNOSIS — R519 Headache, unspecified: Secondary | ICD-10-CM | POA: Diagnosis not present

## 2022-04-16 LAB — PREGNANCY, URINE: Preg Test, Ur: NEGATIVE

## 2022-04-16 LAB — CBC
HCT: 42.7 % (ref 36.0–46.0)
Hemoglobin: 14.6 g/dL (ref 12.0–15.0)
MCH: 30.7 pg (ref 26.0–34.0)
MCHC: 34.2 g/dL (ref 30.0–36.0)
MCV: 89.7 fL (ref 80.0–100.0)
Platelets: 247 10*3/uL (ref 150–400)
RBC: 4.76 MIL/uL (ref 3.87–5.11)
RDW: 11.8 % (ref 11.5–15.5)
WBC: 13.8 10*3/uL — ABNORMAL HIGH (ref 4.0–10.5)
nRBC: 0 % (ref 0.0–0.2)

## 2022-04-16 LAB — URINALYSIS, ROUTINE W REFLEX MICROSCOPIC
Bilirubin Urine: NEGATIVE
Glucose, UA: NEGATIVE mg/dL
Hgb urine dipstick: NEGATIVE
Ketones, ur: 40 mg/dL — AB
Leukocytes,Ua: NEGATIVE
Nitrite: NEGATIVE
Specific Gravity, Urine: 1.026 (ref 1.005–1.030)
pH: 6 (ref 5.0–8.0)

## 2022-04-16 LAB — BASIC METABOLIC PANEL
Anion gap: 11 (ref 5–15)
BUN: 7 mg/dL (ref 6–20)
CO2: 24 mmol/L (ref 22–32)
Calcium: 9.9 mg/dL (ref 8.9–10.3)
Chloride: 97 mmol/L — ABNORMAL LOW (ref 98–111)
Creatinine, Ser: 0.79 mg/dL (ref 0.44–1.00)
GFR, Estimated: >60 mL/min (ref >60)
Glucose, Bld: 156 mg/dL — ABNORMAL HIGH (ref 70–99)
Potassium: 3.5 mmol/L (ref 3.5–5.1)
Sodium: 132 mmol/L — ABNORMAL LOW (ref 135–145)

## 2022-04-16 LAB — RESP PANEL BY RT-PCR (FLU A&B, COVID) ARPGX2
Influenza A by PCR: NEGATIVE
Influenza B by PCR: NEGATIVE
SARS Coronavirus 2 by RT PCR: NEGATIVE

## 2022-04-16 MED ORDER — ACETAMINOPHEN 500 MG PO TABS
1000.0000 mg | ORAL_TABLET | Freq: Once | ORAL | Status: AC
  Administered 2022-04-16: 1000 mg via ORAL
  Filled 2022-04-16: qty 2

## 2022-04-16 MED ORDER — LOPERAMIDE HCL 2 MG PO CAPS
2.0000 mg | ORAL_CAPSULE | Freq: Four times a day (QID) | ORAL | 0 refills | Status: DC | PRN
  Filled 2022-04-16: qty 12, 3d supply, fill #0

## 2022-04-16 MED ORDER — ONDANSETRON 8 MG PO TBDP
8.0000 mg | ORAL_TABLET | Freq: Three times a day (TID) | ORAL | 0 refills | Status: DC | PRN
  Filled 2022-04-16: qty 12, 4d supply, fill #0

## 2022-04-16 NOTE — ED Notes (Signed)
Pt d/c home per MD order. Discharge summary reviewed with pt, pt verbalizes understanding. Ambulatory, no s/s of acute distress noted at discharge.

## 2022-04-16 NOTE — ED Provider Notes (Signed)
[facility] Provider Note   CSN: [id number] Arrival date & time: 04/16/22  [id number]     History  Chief Complaint  Patient presents with   Fever   Nausea   Vomiting   Back Pain    Kelli Hunter is a 21 y.o. female.   Fever Back Pain Associated symptoms: fever    Patient denies any significant medical history other than currently having covid.  Patient presents to the ED with complaints of nausea lower back pain fevers chills.  Symptoms started yesterday.  She has had some pain in her lower back.  She has had some urinary frequency but no discomfort with urination.  She is not having any abdominal pain.  No cough.  No sore throat.  She has a mild headache.  She thought she noticed some discoloration in her left upper arm but no discrete rash.  Home Medications Prior to Admission medications   Medication Sig Start Date End Date Taking? Authorizing Provider  ondansetron (ZOFRAN-ODT) 8 MG disintegrating tablet Take 1 tablet (8 mg total) by mouth every 8 (eight) hours as needed for nausea or vomiting. 04/16/22  Yes Dorie Rank, MD  fluticasone [facility]) 50 MCG/ACT nasal spray Place 1 spray into both nostrils daily. Begin by using 2 sprays in each nare daily for 3 to 5 days, then decrease to 1 spray in each nare daily. 03/21/22   Lynden Oxford Scales, PA-C      Allergies    Patient has no known allergies.    Review of Systems   Review of Systems  Constitutional:  Positive for fever.  Musculoskeletal:  Positive for back pain.    Physical Exam Updated Vital Signs BP 111/74   Pulse 87   Temp 98.3 F (36.8 C)   Resp 16   Ht 1.575 m (5\' 2" )   Wt 77.1 kg   LMP 03/26/2022   SpO2 98%   BMI 31.09 kg/m  Physical Exam Vitals and nursing note reviewed.  Constitutional:      General: She is not in acute distress.    Appearance: She is well-developed.  HENT:     Head: Normocephalic and atraumatic.     Right Ear: External ear normal.     Left Ear:  External ear normal.     Mouth/Throat:     Mouth: Mucous membranes are moist.     Pharynx: No oropharyngeal exudate or posterior oropharyngeal erythema.  Eyes:     General: No scleral icterus.       Right eye: No discharge.        Left eye: No discharge.     Conjunctiva/sclera: Conjunctivae normal.  Neck:     Trachea: No tracheal deviation.  Cardiovascular:     Rate and Rhythm: Normal rate and regular rhythm.  Pulmonary:     Effort: Pulmonary effort is normal. No respiratory distress.     Breath sounds: Normal breath sounds. No stridor. No wheezing or rales.  Abdominal:     General: Bowel sounds are normal. There is no distension.     Palpations: Abdomen is soft.     Tenderness: There is no abdominal tenderness. There is no guarding or rebound.  Musculoskeletal:        General: No swelling, tenderness or deformity.     Cervical back: Normal range of motion and neck supple. No rigidity or tenderness.     Comments: No erythema noted in her left upper extremity, no tenderness  Skin:    General:  Skin is warm and dry.     Findings: No rash.  Neurological:     General: No focal deficit present.     Mental Status: She is alert.     Cranial Nerves: No cranial nerve deficit (no facial droop, extraocular movements intact, no slurred speech).     Sensory: No sensory deficit.     Motor: No abnormal muscle tone or seizure activity.     Coordination: Coordination normal.  Psychiatric:        Mood and Affect: Mood normal.     ED Results / Procedures / Treatments   Labs (all labs ordered are listed, but only abnormal results are displayed) Labs Reviewed  CBC - Abnormal; Notable for the following components:      Result Value   WBC 13.8 (*)    All other components within normal limits  BASIC METABOLIC PANEL - Abnormal; Notable for the following components:   Sodium 132 (*)    Chloride 97 (*)    Glucose, Bld 156 (*)    All other components within normal limits  URINALYSIS, ROUTINE W  REFLEX MICROSCOPIC - Abnormal; Notable for the following components:   APPearance HAZY (*)    Ketones, ur 40 (*)    Protein, ur TRACE (*)    Bacteria, UA RARE (*)    All other components within normal limits  RESP PANEL BY RT-PCR (FLU A&B, COVID) ARPGX2  URINE CULTURE  PREGNANCY, URINE    EKG None  Radiology DG Chest 2 View  Result Date: 04/16/2022 CLINICAL DATA:  Fever and back pain.  Nausea and vomiting. EXAM: CHEST - 2 VIEW COMPARISON:  Chest two views 11/11/2020 FINDINGS: Cardiac silhouette and mediastinal contours are within normal limits. The lungs are clear. No pleural effusion or pneumothorax. No acute skeletal abnormality. IMPRESSION: No active cardiopulmonary disease. Electronically Signed   By: Yvonne Kendall M.D.   On: 04/16/2022 09:35    Procedures Procedures    Medications Ordered in ED Medications  acetaminophen (TYLENOL) tablet 1,000 mg (1,000 mg Oral Given 04/16/22 0914)    ED Course/ Medical Decision Making/ A&P Clinical Course as of 04/16/22 1138  Wed Apr 16, 2022  1047 Resp Panel by RT-PCR (Flu A&B, Covid) Anterior Nasal Swab Covid flu negative [JK]  1047 CBC(!) Leukocytosis noted [JK]  4627 Basic metabolic panel(!) Sodium and chloride slightly decreased, glucose increased, not definitive for diabetes on this nonfasting level [JK]  1105 DG Chest 2 View Chest x-ray without signs of pneumonia [JK]  1105 Urinalysis, Routine w reflex microscopic(!) Urinalysis with rare bacteria negative nitrite negative leukocyte esterase [JK]    Clinical Course User Index [JK] Dorie Rank, MD                           Medical Decision Making Differential diagnosis includes but not limited to meningitis, pyelonephritis, pneumonia, COVID, influenza  Problems Addressed: Diarrhea of presumed infectious origin: acute illness or injury that poses a threat to life or bodily functions Febrile illness: acute illness or injury that poses a threat to life or bodily  functions  Amount and/or Complexity of Data Reviewed Labs: ordered. Decision-making details documented in ED Course. Radiology: ordered and independent interpretation performed. Decision-making details documented in ED Course.  Risk OTC drugs. Prescription drug management.   Patient presented to ED for evaluation of fever.  While she was here she also started to have several loose stools.  Patient's exam is reassuring.  She  is nontoxic.  She does not have any neck stiffness or meningismus.  I doubt meningitis.  Patient does have a leukocytosis but no findings of definitive bacterial infection.  Chest x-ray does not show pneumonia.  Urinalysis is not normal but not definitive for urinary tract infection.  She has had some urinary frequency we will send off a urine culture.  No swelling or erythema in her extremities to suggest cellulitis.  Patient has mentioned some diarrhea.  Is possible this is a viral gastroenteritis.  Is not noticed any blood in her stool.  No foreign travel.  No abdominal pain.  Do not feel that CT scan is necessary at this time.  Will discharge home with supportive care.  Warning signs and precautions were discussed        Final Clinical Impression(s) / ED Diagnoses Final diagnoses:  Febrile illness  Diarrhea of presumed infectious origin    Rx / DC Orders ED Discharge Orders          Ordered    ondansetron (ZOFRAN-ODT) 8 MG disintegrating tablet  Every 8 hours PRN        04/16/22 1134              Dorie Rank, MD 04/16/22 1138

## 2022-04-16 NOTE — ED Triage Notes (Signed)
Pt c/o nausea, vomiting, bilateral lower back pain, fever, chills, and neck pain since yesterday. Pt denies abdominal pain.

## 2022-04-16 NOTE — Discharge Instructions (Addendum)
Take the medications as needed for nausea and diarrhea.  Take Tylenol or ibuprofen to help with your fever.  Follow-up with your doctor to be rechecked in a couple of days if your symptoms have not resolved.  Return to the ER for abdominal pain worsening symptoms.

## 2022-04-17 ENCOUNTER — Other Ambulatory Visit: Payer: Self-pay

## 2022-04-17 ENCOUNTER — Encounter (HOSPITAL_BASED_OUTPATIENT_CLINIC_OR_DEPARTMENT_OTHER): Payer: Self-pay | Admitting: Emergency Medicine

## 2022-04-17 ENCOUNTER — Emergency Department (HOSPITAL_BASED_OUTPATIENT_CLINIC_OR_DEPARTMENT_OTHER): Payer: Medicaid Other

## 2022-04-17 ENCOUNTER — Emergency Department (HOSPITAL_BASED_OUTPATIENT_CLINIC_OR_DEPARTMENT_OTHER)
Admission: EM | Admit: 2022-04-17 | Discharge: 2022-04-17 | Disposition: A | Discharge status: Home / Self Care | Payer: Medicaid Other | Attending: Emergency Medicine | Admitting: Emergency Medicine

## 2022-04-17 ENCOUNTER — Ambulatory Visit
Admission: RE | Admit: 2022-04-17 | Discharge: 2022-04-17 | Disposition: A | Discharge status: Home / Self Care | Payer: Medicaid Other | Source: Ambulatory Visit

## 2022-04-17 VITALS — BP 129/85 | HR 90 | Temp 98.5°F | Resp 18

## 2022-04-17 DIAGNOSIS — R7309 Other abnormal glucose: Secondary | ICD-10-CM | POA: Insufficient documentation

## 2022-04-17 DIAGNOSIS — R109 Unspecified abdominal pain: Secondary | ICD-10-CM

## 2022-04-17 DIAGNOSIS — R197 Diarrhea, unspecified: Secondary | ICD-10-CM | POA: Diagnosis present

## 2022-04-17 DIAGNOSIS — K529 Noninfective gastroenteritis and colitis, unspecified: Secondary | ICD-10-CM | POA: Insufficient documentation

## 2022-04-17 LAB — CBC WITH DIFFERENTIAL/PLATELET
Abs Immature Granulocytes: 0.03 10*3/uL (ref 0.00–0.07)
Basophils Absolute: 0 10*3/uL (ref 0.0–0.1)
Basophils Relative: 0 %
Eosinophils Absolute: 0.1 10*3/uL (ref 0.0–0.5)
Eosinophils Relative: 1 %
HCT: 42.2 % (ref 36.0–46.0)
Hemoglobin: 14.5 g/dL (ref 12.0–15.0)
Immature Granulocytes: 0 %
Lymphocytes Relative: 14 %
Lymphs Abs: 1.3 10*3/uL (ref 0.7–4.0)
MCH: 30.8 pg (ref 26.0–34.0)
MCHC: 34.4 g/dL (ref 30.0–36.0)
MCV: 89.6 fL (ref 80.0–100.0)
Monocytes Absolute: 0.8 10*3/uL (ref 0.1–1.0)
Monocytes Relative: 10 %
Neutro Abs: 6.6 10*3/uL (ref 1.7–7.7)
Neutrophils Relative %: 75 %
Platelets: 271 10*3/uL (ref 150–400)
RBC: 4.71 MIL/uL (ref 3.87–5.11)
RDW: 11.8 % (ref 11.5–15.5)
WBC: 8.9 10*3/uL (ref 4.0–10.5)
nRBC: 0 % (ref 0.0–0.2)

## 2022-04-17 LAB — URINALYSIS, ROUTINE W REFLEX MICROSCOPIC
Bilirubin Urine: NEGATIVE
Glucose, UA: NEGATIVE mg/dL
Hgb urine dipstick: NEGATIVE
Ketones, ur: NEGATIVE mg/dL
Leukocytes,Ua: NEGATIVE
Nitrite: NEGATIVE
Protein, ur: NEGATIVE mg/dL
Specific Gravity, Urine: 1.01 (ref 1.005–1.030)
pH: 6.5 (ref 5.0–8.0)

## 2022-04-17 LAB — BASIC METABOLIC PANEL
Anion gap: 10 (ref 5–15)
BUN: 6 mg/dL (ref 6–20)
CO2: 26 mmol/L (ref 22–32)
Calcium: 9 mg/dL (ref 8.9–10.3)
Chloride: 100 mmol/L (ref 98–111)
Creatinine, Ser: 0.77 mg/dL (ref 0.44–1.00)
GFR, Estimated: >60 mL/min (ref >60)
Glucose, Bld: 109 mg/dL — ABNORMAL HIGH (ref 70–99)
Potassium: 3.6 mmol/L (ref 3.5–5.1)
Sodium: 136 mmol/L (ref 135–145)

## 2022-04-17 LAB — PREGNANCY, URINE: Preg Test, Ur: NEGATIVE

## 2022-04-17 LAB — URINE CULTURE: Culture: NO GROWTH

## 2022-04-17 MED ORDER — KETOROLAC TROMETHAMINE 15 MG/ML IJ SOLN
15.0000 mg | Freq: Once | INTRAMUSCULAR | Status: AC
  Administered 2022-04-17: 15 mg via INTRAVENOUS
  Filled 2022-04-17: qty 1

## 2022-04-17 MED ORDER — DICYCLOMINE HCL 20 MG PO TABS: 20.0000 mg | ORAL_TABLET | Freq: Two times a day (BID) | ORAL | 0 refills | Status: DC

## 2022-04-17 MED ORDER — LACTATED RINGERS IV BOLUS
2000.0000 mL | Freq: Once | INTRAVENOUS | Status: AC
  Administered 2022-04-17: 2000 mL via INTRAVENOUS

## 2022-04-17 MED ORDER — AMOXICILLIN-POT CLAVULANATE 875-125 MG PO TABS: 1.0000 | ORAL_TABLET | Freq: Two times a day (BID) | ORAL | 0 refills | Status: DC

## 2022-04-17 MED ORDER — ONDANSETRON HCL 4 MG/2ML IJ SOLN
4.0000 mg | Freq: Once | INTRAMUSCULAR | Status: AC
  Administered 2022-04-17: 4 mg via INTRAVENOUS
  Filled 2022-04-17: qty 2

## 2022-04-17 NOTE — ED Notes (Signed)
Pt awake and alert sitting up in bed with family member at bedside.  RR even and unlabored on RA with symmetrical rise and fall of chest.  Pt reports ongoing lower abd pain at times radiating to the back with associated n/v/d and fever.  Pt reports increased urine output but otherwise denies genitourinary complaint.

## 2022-04-17 NOTE — ED Notes (Signed)
Patient is being discharged from the Urgent Care and sent to the Emergency Department via POV . Per RM, patient is in need of higher level of care due to abd pain. Patient is aware and verbalizes understanding of plan of care.  Vitals:   04/17/22 1701  BP: 129/85  Pulse: 90  Resp: 18  Temp: 98.5 F (36.9 C)  SpO2: 95%

## 2022-04-17 NOTE — ED Triage Notes (Signed)
Pt here for lower abd and back pain x 3 days; seen in ED yesterday and told if not better then to follow up at [facility]; pt sts continued fever as well

## 2022-04-17 NOTE — Discharge Instructions (Signed)
Your work-up today showed evidence of colitis.  Given your symptoms of vomiting, and diarrhea this could be as a result of the viral gastroenteritis or this may be a bacterial infection.  Given your reported fever we will go ahead and treat you with an antibiotic.  For any worsening or concerning symptoms please return to the emergency room.  Otherwise follow-up with your primary care provider.  If you do not have a primary care provider have attached information for [facility] for you to establish care with.

## 2022-04-17 NOTE — ED Provider Notes (Signed)
[facility] EMERGENCY DEPARTMENT Provider Note   CSN: [id number] Arrival date & time: 04/17/22  1813     History  Chief Complaint  Patient presents with   Abdominal Pain   Fever   Emesis   Diarrhea    Kelli Hunter is a 21 y.o. female.  21 year old female presents today for evaluation of abdominal pain, vomiting, diarrhea of 2-day duration.  She was evaluated in the emergency room and diagnosed with gastroenteritis and discharged with Zofran, Imodium however she had no improvement in her symptoms she followed up with urgent care who recommended ER eval.  She also endorsed intermittent fevers over the past couple days states Tmax in the past 24 hours of 99.0.  She is accompanied by her mom.  She states she is having to tolerate some Gatorade and ginger ale today however not significant amount.  She states she had 1 episode of dysuria yesterday however since then has not had any additional episodes.  The history is provided by the patient. No language interpreter was used.       Home Medications Prior to Admission medications   Medication Sig Start Date End Date Taking? Authorizing Provider  fluticasone (FLONASE) 50 MCG/ACT nasal spray Place 1 spray into both nostrils daily. Begin by using 2 sprays in each nare daily for 3 to 5 days, then decrease to 1 spray in each nare daily. 03/21/22   Theadora Rama Scales, PA-C  loperamide (IMODIUM) 2 MG capsule Take 1 capsule (2 mg total) by mouth 4 (four) times daily as needed for diarrhea or loose stools. 04/16/22   Linwood Dibbles, MD  ondansetron (ZOFRAN-ODT) 8 MG disintegrating tablet Take 1 tablet (8 mg total) by mouth every 8 (eight) hours as needed for nausea or vomiting. 04/16/22   Linwood Dibbles, MD      Allergies    Patient has no known allergies.    Review of Systems   Review of Systems  Constitutional:  Positive for fever. Negative for chills.  Gastrointestinal:  Positive for abdominal pain, diarrhea, nausea and vomiting.   Genitourinary:  Negative for difficulty urinating, dysuria, flank pain, vaginal bleeding and vaginal discharge.  All other systems reviewed and are negative.   Physical Exam Updated Vital Signs BP (!) 122/91 (BP Location: Left Arm)   Pulse 92   Temp 97.9 F (36.6 C) (Oral)   Resp 18   Ht 5\' 2"  (1.575 m)   Wt 74.8 kg   LMP 03/26/2022   SpO2 99%   BMI 30.18 kg/m  Physical Exam Vitals and nursing note reviewed.  Constitutional:      General: She is not in acute distress.    Appearance: Normal appearance. She is not ill-appearing.  HENT:     Head: Normocephalic and atraumatic.     Nose: Nose normal.  Eyes:     General: No scleral icterus.    Extraocular Movements: Extraocular movements intact.     Conjunctiva/sclera: Conjunctivae normal.  Cardiovascular:     Rate and Rhythm: Normal rate and regular rhythm.     Pulses: Normal pulses.  Pulmonary:     Effort: Pulmonary effort is normal. No respiratory distress.     Breath sounds: Normal breath sounds. No wheezing or rales.  Abdominal:     General: There is no distension.     Palpations: Abdomen is soft.     Tenderness: There is abdominal tenderness. There is no guarding.  Musculoskeletal:        General: Normal range of  motion.     Cervical back: Normal range of motion.  Skin:    General: Skin is warm and dry.  Neurological:     General: No focal deficit present.     Mental Status: She is alert and oriented to person, place, and time. Mental status is at baseline.     ED Results / Procedures / Treatments   Labs (all labs ordered are listed, but only abnormal results are displayed) Labs Reviewed  BASIC METABOLIC PANEL - Abnormal; Notable for the following components:      Result Value   Glucose, Bld 109 (*)    All other components within normal limits  URINE CULTURE  URINALYSIS, ROUTINE W REFLEX MICROSCOPIC  PREGNANCY, URINE  CBC WITH DIFFERENTIAL/PLATELET    EKG None  Radiology CT Renal Stone  Study  Result Date: 04/17/2022 CLINICAL DATA:  Flank pain, kidney stone suspected. EXAM: CT ABDOMEN AND PELVIS WITHOUT CONTRAST TECHNIQUE: Multidetector CT imaging of the abdomen and pelvis was performed following the standard protocol without IV contrast. RADIATION DOSE REDUCTION: This exam was performed according to the departmental dose-optimization program which includes automated exposure control, adjustment of the mA and/or kV according to patient size and/or use of iterative reconstruction technique. COMPARISON:  None Available. FINDINGS: Lower chest: No acute abnormality. Hepatobiliary: Unremarkable noncontrast enhanced appearance of the hepatic parenchyma. Gallbladder is unremarkable. No biliary ductal dilation. Pancreas: No pancreatic ductal dilation or evidence of acute inflammation. Spleen: No splenomegaly. Adrenals/Urinary Tract: Bilateral adrenal glands appear normal. No hydronephrosis. Punctate nonobstructive right renal calculi measure up to 1-2 mm. No obstructive ureteral or bladder calculi. Urinary bladder is nondistended limiting evaluation. Stomach/Bowel: Stomach is unremarkable for degree of distension. No pathologic dilation of small or large bowel. Normal appendix. Right-sided predominant near pancolonic wall thickening with adjacent inflammatory stranding. Vascular/Lymphatic: Normal caliber abdominal aorta. No pathologically enlarged abdominal or pelvic lymph nodes. Reproductive: Uterus and bilateral adnexa are unremarkable. Other: Small volume pelvic free fluid. No walled off fluid collection. No pneumoperitoneum. No portal venous gas. No pneumatosis. Musculoskeletal: No acute osseous abnormality. IMPRESSION: 1. Right-sided predominant near pancolonic wall thickening with adjacent inflammatory stranding, suggestive of an infectious or inflammatory colitis. 2. Punctate nonobstructive right renal calculi measure up to 1-2 mm. No obstructive ureteral or bladder calculi. 3. Small volume  pelvic free fluid, likely reactive. Electronically Signed   By: Dahlia Bailiff M.D.   On: 04/17/2022 18:51   DG Chest 2 View  Result Date: 04/16/2022 CLINICAL DATA:  Fever and back pain.  Nausea and vomiting. EXAM: CHEST - 2 VIEW COMPARISON:  Chest two views 11/11/2020 FINDINGS: Cardiac silhouette and mediastinal contours are within normal limits. The lungs are clear. No pleural effusion or pneumothorax. No acute skeletal abnormality. IMPRESSION: No active cardiopulmonary disease. Electronically Signed   By: Yvonne Kendall M.D.   On: 04/16/2022 09:35    Procedures Procedures    Medications Ordered in ED Medications - No data to display  ED Course/ Medical Decision Making/ A&P                           Medical Decision Making Amount and/or Complexity of Data Reviewed Labs: ordered. Radiology: ordered.  Risk Prescription drug management.   Medical Decision Making / ED Course   This patient presents to the ED for concern of abdominal pain, vomiting, diarrhea, this involves an extensive number of treatment options, and is a complaint that carries with it a high risk of complications  and morbidity.  The differential diagnosis includes gastroenteritis, colitis, pancreatitis, appendicitis, nephrolithiasis  MDM: 21 year old female with past medical history significant for nephrolithiasis presents today for evaluation of above-mentioned symptoms.  Symptoms consistent with gastroenteritis however she has also been having fever the past 2 days.  1 episode of dysuria yesterday however none since then.  Tmax in the past 24 hours of 99.0.  Decreased p.o. intake.  Work-up shows UA without evidence of UTI, CBC without leukocytosis or anemia.  BMP with glucose of 109 otherwise unremarkable.  Pregnancy test negative.  CT renal stone study shows pericolonic wall thickening and adjacent inflammatory stranding suggestive of an infectious or inflammatory colitis on the right side.  Nonobstructive renal  calculi.  Without other acute findings.  Likely has colitis.  Given the mention fever will treat with antibiotics to cover for bacterial origin.  Will provide Toradol, Zofran, and 2 L fluid bolus and reevaluate. On reevaluation patient reports she feels much better.  Fluid infusion complete.  Will discharge.  We will start patient on Augmentin for colitis.  Return precautions discussed.  Discussed follow-up with primary care provider.   Additional history obtained: -Additional history obtained from recent ED visit, and urgent care visit. -External records from outside source obtained and reviewed including: Chart review including previous notes, labs, imaging, consultation notes   Lab Tests: -I ordered, reviewed, and interpreted labs.   The pertinent results include:   Labs Reviewed  BASIC METABOLIC PANEL - Abnormal; Notable for the following components:      Result Value   Glucose, Bld 109 (*)    All other components within normal limits  URINE CULTURE  URINALYSIS, ROUTINE W REFLEX MICROSCOPIC  PREGNANCY, URINE  CBC WITH DIFFERENTIAL/PLATELET      EKG  EKG Interpretation  Date/Time:    Ventricular Rate:    PR Interval:    QRS Duration:   QT Interval:    QTC Calculation:   R Axis:     Text Interpretation:           Imaging Studies ordered: I ordered imaging studies including CT renal stone study I independently visualized and interpreted imaging. I agree with the radiologist interpretation   Medicines ordered and prescription drug management: Meds ordered this encounter  Medications   ketorolac (TORADOL) 15 MG/ML injection 15 mg   ondansetron (ZOFRAN) injection 4 mg   lactated ringers bolus 2,000 mL    -I have reviewed the patients home medicines and have made adjustments as needed  Critical interventions IV fluids  Reevaluation: After the interventions noted above, I reevaluated the patient and found that they have :improved  Co morbidities that  complicate the patient evaluation History reviewed. No pertinent past medical history.    Dispostion: Patient appropriate for discharge.  Discharged in stable condition.  Return precautions discussed.  Patient voices understanding and is in agreement with plan.  Final Clinical Impression(s) / ED Diagnoses Final diagnoses:  Colitis    Rx / DC Orders ED Discharge Orders          Ordered    amoxicillin-clavulanate (AUGMENTIN) 875-125 MG tablet  Every 12 hours        04/17/22 2119    dicyclomine (BENTYL) 20 MG tablet  2 times daily        04/17/22 2119              Marita Kansas, PA-C 04/17/22 2119    Gwyneth Sprout, MD 04/18/22 [phone]

## 2022-04-17 NOTE — ED Triage Notes (Signed)
Pt c/o n/v/d, BL lower back pain, abdominal pain and intermittent fevers x 48 hours. Sent here from [facility] for further eval. Endorses burning with urination and urinary frequency. Denies vaginal issues.

## 2022-04-17 NOTE — ED Notes (Signed)
Pt agreeable with d/c plan as discussed by provider- this nurse has verbally reinforced d/c instructions and provided pt with written copy - pt acknowledges verbal understanding and denies any addl questions, concerns, needs- pt ambulatory at d/c with steady gait; vitals stable; no distress.   

## 2022-04-17 NOTE — ED Provider Notes (Signed)
Patient presents today with mother for evaluation of continued lower abdominal pain and cramping, low back pain, nausea, vomiting and diarrhea after being seen in ED yesterday. Mother is frustrated that patient was instructed to come to urgent care if no improvement and patient reports she was told this so that she would "not take up a bed in the ED" for her symptoms. I explained that symptoms may be related to viral illness but discussed that at this urgent care I could not provide further imaging (suspect she may need CT or [place]) and we could repeat labs but nothing would be resulted today and possibly not tomorrow. Offered continued supportive care vs evaluation in the ED for further imaging, etc and patient and mother would prefer further evaluation. Mother will transport to local ED.    Francene Finders, PA-C 04/17/22 1758

## 2022-04-18 LAB — URINE CULTURE: Culture: NO GROWTH

## 2022-07-20 ENCOUNTER — Emergency Department (HOSPITAL_BASED_OUTPATIENT_CLINIC_OR_DEPARTMENT_OTHER)
Admission: EM | Admit: 2022-07-20 | Discharge: 2022-07-20 | Disposition: A | Discharge status: Home / Self Care | Payer: Medicaid Other | Attending: Emergency Medicine | Admitting: Emergency Medicine

## 2022-07-20 ENCOUNTER — Encounter (HOSPITAL_BASED_OUTPATIENT_CLINIC_OR_DEPARTMENT_OTHER): Payer: Self-pay | Admitting: Emergency Medicine

## 2022-07-20 ENCOUNTER — Emergency Department (HOSPITAL_BASED_OUTPATIENT_CLINIC_OR_DEPARTMENT_OTHER): Payer: Medicaid Other

## 2022-07-20 ENCOUNTER — Other Ambulatory Visit: Payer: Self-pay

## 2022-07-20 DIAGNOSIS — R1011 Right upper quadrant pain: Secondary | ICD-10-CM | POA: Insufficient documentation

## 2022-07-20 DIAGNOSIS — R1031 Right lower quadrant pain: Secondary | ICD-10-CM | POA: Insufficient documentation

## 2022-07-20 DIAGNOSIS — R109 Unspecified abdominal pain: Secondary | ICD-10-CM

## 2022-07-20 LAB — LIPASE, BLOOD: Lipase: 22 U/L (ref 11–51)

## 2022-07-20 LAB — CBC WITH DIFFERENTIAL/PLATELET
Abs Immature Granulocytes: 0.02 10*3/uL (ref 0.00–0.07)
Basophils Absolute: 0 10*3/uL (ref 0.0–0.1)
Basophils Relative: 0 %
Eosinophils Absolute: 0.1 10*3/uL (ref 0.0–0.5)
Eosinophils Relative: 1 %
HCT: 41.3 % (ref 36.0–46.0)
Hemoglobin: 13.7 g/dL (ref 12.0–15.0)
Immature Granulocytes: 0 %
Lymphocytes Relative: 34 %
Lymphs Abs: 2.8 10*3/uL (ref 0.7–4.0)
MCH: 30.4 pg (ref 26.0–34.0)
MCHC: 33.2 g/dL (ref 30.0–36.0)
MCV: 91.8 fL (ref 80.0–100.0)
Monocytes Absolute: 0.6 10*3/uL (ref 0.1–1.0)
Monocytes Relative: 8 %
Neutro Abs: 4.5 10*3/uL (ref 1.7–7.7)
Neutrophils Relative %: 57 %
Platelets: 320 10*3/uL (ref 150–400)
RBC: 4.5 MIL/uL (ref 3.87–5.11)
RDW: 11.7 % (ref 11.5–15.5)
WBC: 8 10*3/uL (ref 4.0–10.5)
nRBC: 0 % (ref 0.0–0.2)

## 2022-07-20 LAB — URINALYSIS, ROUTINE W REFLEX MICROSCOPIC
Bacteria, UA: NONE SEEN
Bilirubin Urine: NEGATIVE
Glucose, UA: NEGATIVE mg/dL
Ketones, ur: NEGATIVE mg/dL
Leukocytes,Ua: NEGATIVE
Nitrite: NEGATIVE
Protein, ur: NEGATIVE mg/dL
RBC / HPF: >50 RBC/hpf — ABNORMAL HIGH (ref 0–5)
Specific Gravity, Urine: 1.012 (ref 1.005–1.030)
pH: 7 (ref 5.0–8.0)

## 2022-07-20 LAB — COMPREHENSIVE METABOLIC PANEL
ALT: 13 U/L (ref 0–44)
AST: 15 U/L (ref 15–41)
Albumin: 4.4 g/dL (ref 3.5–5.0)
Alkaline Phosphatase: 64 U/L (ref 38–126)
Anion gap: 3 — ABNORMAL LOW (ref 5–15)
BUN: 10 mg/dL (ref 6–20)
CO2: 28 mmol/L (ref 22–32)
Calcium: 9.7 mg/dL (ref 8.9–10.3)
Chloride: 106 mmol/L (ref 98–111)
Creatinine, Ser: 0.77 mg/dL (ref 0.44–1.00)
GFR, Estimated: >60 mL/min (ref >60)
Glucose, Bld: 86 mg/dL (ref 70–99)
Potassium: 3.7 mmol/L (ref 3.5–5.1)
Sodium: 137 mmol/L (ref 135–145)
Total Bilirubin: 0.2 mg/dL — ABNORMAL LOW (ref 0.3–1.2)
Total Protein: 7.1 g/dL (ref 6.5–8.1)

## 2022-07-20 LAB — PREGNANCY, URINE: Preg Test, Ur: NEGATIVE

## 2022-07-20 MED ORDER — SODIUM CHLORIDE 0.9 % IV BOLUS
1000.0000 mL | Freq: Once | INTRAVENOUS | Status: AC
  Administered 2022-07-20: 1000 mL via INTRAVENOUS

## 2022-07-20 MED ORDER — IOHEXOL 300 MG/ML  SOLN
100.0000 mL | Freq: Once | INTRAMUSCULAR | Status: AC | PRN
  Administered 2022-07-20: 100 mL via INTRAVENOUS

## 2022-07-20 NOTE — ED Notes (Signed)
EDPA at [facility]. Family at [facility]. Alert, NAD, calm, interactive. Pain mild and fluctuates.

## 2022-07-20 NOTE — Discharge Instructions (Addendum)
At this time there does not appear to be the presence of an emergent medical condition, however there is always the potential for conditions to change. Please read and follow the below instructions.  Please return to the Emergency Department immediately for any new or worsening symptoms or if your symptoms do not improve within 3 days. Please be sure to follow up with your Primary Care Provider within one week regarding your visit today; please call their office to schedule an appointment even if you are feeling better for a follow-up visit.    Please read the additional information packets attached to your discharge summary.  Go to the nearest Emergency Department immediately if: You have fever or chills You cannot stop vomiting. Your pain is only in areas of your belly, such as the right side or the left lower part of the belly. You have bloody or black poop, or poop that looks like tar. You have very bad pain, cramping, or bloating in your belly. You have signs of not having enough fluid or water in your body (dehydration), such as: Dark pee, very little pee, or no pee. Cracked lips. Dry mouth. Sunken eyes. Sleepiness. Weakness. You have trouble breathing or chest pain. You have any new/concerning or worsening of symptoms.  Do not take your medicine if  develop an itchy rash, swelling in your mouth or lips, or difficulty breathing; call 911 and seek immediate emergency medical attention if this occurs.  You may review your lab tests and imaging results in their entirety on your MyChart account.  Please discuss all results of fully with your primary care provider and other specialist at your follow-up visit.  Note: Portions of this text may have been transcribed using voice recognition software. Every effort was made to ensure accuracy; however, inadvertent computerized transcription errors may still be present.

## 2022-07-20 NOTE — ED Provider Notes (Signed)
[facility] Provider Note   CSN: [id number] Arrival date & time: 07/20/22  1244     History  Chief Complaint  Patient presents with   Flank Pain    Kelli Hunter is a 22 y.o. female presents with her partner today for evaluation of right flank pain that has been mild and intermittent x 5 days worse over the past 2 days, she orts pain is aching and intermittent, it radiates occasionally down to the right lower quadrant, pain does not have any clear aggravating or alleviating factors and she describes as moderate in intensity.  She reports this is associated with a feeling of urinary frequency.  She does report she is on her menstrual cycle at this time.  She denies any fall or injury, no fever, chills, nausea, vomiting, diarrhea, dysuria/hematuria, numbness, tingling or any additional concerns.  Of note patient reports that she has a known right sided kidney stone seen on CT scan last year she is concerned this may be the cause of her symptoms. HPI     Home Medications Prior to Admission medications   Medication Sig Start Date End Date Taking? Authorizing Provider  amoxicillin-clavulanate (AUGMENTIN) 875-125 MG tablet Take 1 tablet by mouth every 12 (twelve) hours. 04/17/22   Deatra Canter, Amjad, PA-C  dicyclomine (BENTYL) 20 MG tablet Take 1 tablet (20 mg total) by mouth 2 (two) times daily. 04/17/22   Deatra Canter, Amjad, PA-C  fluticasone (FLONASE) 50 MCG/ACT nasal spray Place 1 spray into both nostrils daily. Begin by using 2 sprays in each nare daily for 3 to 5 days, then decrease to 1 spray in each nare daily. 03/21/22   Lynden Oxford Scales, PA-C  loperamide (IMODIUM) 2 MG capsule Take 1 capsule (2 mg total) by mouth 4 (four) times daily as needed for diarrhea or loose stools. 04/16/22   Dorie Rank, MD  ondansetron (ZOFRAN-ODT) 8 MG disintegrating tablet Take 1 tablet (8 mg total) by mouth every 8 (eight) hours as needed for nausea or vomiting. 04/16/22    Dorie Rank, MD      Allergies    Patient has no known allergies.    Review of Systems   Review of Systems Ten systems are reviewed and are negative for acute change except as noted in the HPI  Physical Exam Updated Vital Signs BP 105/74 (BP Location: Right Arm)   Pulse 82   Temp 99.3 F (37.4 C) (Oral)   Resp 15   Ht 5\' 2"  (1.575 m)   Wt 72.6 kg   LMP 07/17/2022 (Exact Date)   SpO2 100%   BMI 29.26 kg/m  Physical Exam Constitutional:      General: She is not in acute distress.    Appearance: Normal appearance. She is well-developed. She is not ill-appearing or diaphoretic.  HENT:     Head: Normocephalic and atraumatic.  Eyes:     General: Vision grossly intact. Gaze aligned appropriately.     Pupils: Pupils are equal, round, and reactive to light.  Neck:     Trachea: Trachea and phonation normal.  Pulmonary:     Effort: Pulmonary effort is normal. No respiratory distress.  Abdominal:     General: There is no distension.     Palpations: Abdomen is soft.     Tenderness: There is abdominal tenderness in the right upper quadrant and right lower quadrant. There is right CVA tenderness. There is no guarding or rebound.  Genitourinary:    Comments: GU examination deferred  by patient Musculoskeletal:        General: Normal range of motion.     Cervical back: Normal range of motion.     Comments: No midline spinal tenderness palpation.  No crepitus step-off or deformity of the spine.  Skin:    General: Skin is warm and dry.  Neurological:     Mental Status: She is alert.     GCS: GCS eye subscore is 4. GCS verbal subscore is 5. GCS motor subscore is 6.     Comments: Speech is clear and goal oriented, follows commands Major Cranial nerves without deficit, no facial droop Moves extremities without ataxia, coordination intact  Psychiatric:        Behavior: Behavior normal.     ED Results / Procedures / Treatments   Labs (all labs ordered are listed, but only abnormal  results are displayed) Labs Reviewed  URINALYSIS, ROUTINE W REFLEX MICROSCOPIC - Abnormal; Notable for the following components:      Result Value   Color, Urine COLORLESS (*)    Hgb urine dipstick LARGE (*)    RBC / HPF >50 (*)    All other components within normal limits  COMPREHENSIVE METABOLIC PANEL - Abnormal; Notable for the following components:   Total Bilirubin 0.2 (*)    Anion gap 3 (*)    All other components within normal limits  PREGNANCY, URINE  CBC WITH DIFFERENTIAL/PLATELET  LIPASE, BLOOD    EKG None  Radiology CT ABDOMEN PELVIS W CONTRAST  Result Date: 07/20/2022 CLINICAL DATA:  Right lower quadrant abdominal pain. EXAM: CT ABDOMEN AND PELVIS WITH CONTRAST TECHNIQUE: Multidetector CT imaging of the abdomen and pelvis was performed using the standard protocol following bolus administration of intravenous contrast. RADIATION DOSE REDUCTION: This exam was performed according to the departmental dose-optimization program which includes automated exposure control, adjustment of the mA and/or kV according to patient size and/or use of iterative reconstruction technique. CONTRAST:  OMNIPAQUE IOHEXOL 300 MG/ML  SOLN COMPARISON:  04/17/2022 FINDINGS: Lower chest: Unremarkable. Hepatobiliary: No suspicious focal abnormality within the liver parenchyma. There is no evidence for gallstones, gallbladder wall thickening, or pericholecystic fluid. No intrahepatic or extrahepatic biliary dilation. Pancreas: No focal mass lesion. No dilatation of the main duct. No intraparenchymal cyst. No peripancreatic edema. Spleen: No splenomegaly. No focal mass lesion. Adrenals/Urinary Tract: No adrenal nodule or mass. Punctate nonobstructing stones are seen in the lower pole right kidney (best seen on coronal imaging). Left kidney unremarkable. No evidence for hydroureter. The urinary bladder appears normal for the degree of distention. Stomach/Bowel: Stomach is unremarkable. No gastric wall  thickening. No evidence of outlet obstruction. Duodenum is normally positioned as is the ligament of Treitz. No small bowel wall thickening. No small bowel dilatation. The terminal ileum is normal. The appendix is normal. No gross colonic mass. No colonic wall thickening. Vascular/Lymphatic: No abdominal aortic aneurysm. There is no gastrohepatic or hepatoduodenal ligament lymphadenopathy. No retroperitoneal or mesenteric lymphadenopathy. No pelvic sidewall lymphadenopathy. Reproductive: The uterus is unremarkable.  There is no adnexal mass. Other: No intraperitoneal free fluid. Musculoskeletal: No worrisome lytic or sclerotic osseous abnormality. IMPRESSION: No acute findings in the abdomen or pelvis. Specifically, no findings to explain the patient's history of right lower quadrant pain. Punctate nonobstructing stones identified lower pole right kidney Electronically Signed   By: Kennith Center M.D.   On: 07/20/2022 17:34    Procedures Procedures    Medications Ordered in ED Medications  sodium chloride 0.9 % bolus 1,000 mL (  0 mLs Intravenous Stopped 07/20/22 1639)  iohexol (OMNIPAQUE) 300 MG/ML solution 100 mL (100 mLs Intravenous Contrast Given 07/20/22 1719)    ED Course/ Medical Decision Making/ A&P                             Medical Decision Making 22 year old female presented for right flank pain rating down to her right abdomen that is been intermittent for the past 5 days worsening in the past 2 days.  On exam she has some right CVA tenderness and some mild right-sided abdominal pain.  She reports she has known kidney stone on the right side and she is concerned about that.  On exam she is in no acute distress, vital signs stable on room air.  Had a long discussion with the patient today regarding advanced imaging today, risk versus benefits of CT imaging were discussed, patient wants to pursue CT imaging 1 to rule out kidney stone disease but also this may be helpful to rule out  appendicitis given she does have some pain to the right side of her abdomen.  Will obtain basic labs and provide IV fluids.  Differential includes but not limited to kidney stone disease, UTI/pyelonephritis, appendicitis, cholecystitis, SBO, ectopic pregnancy, ovarian torsion, menstrual cramps, musculoskeletal pain.  Amount and/or Complexity of Data Reviewed Labs: ordered.    Details: CBC without leukocytosis to suggestAcute infection, noAnemia or thrombocytopenia. Lipase theLimits, doubt acute Titus. CMP shows no emergentTried derangement and AKILFT elevation 's or gap. Pregnancy test negative Urinalysis shows hemoglobin may be due to kidney stone disease versusContaminated catch given she is on her menstrual cycle.  No evidence for UTI. Radiology: ordered.    Details: I have personally reviewed and interpreted patient CT abdomen pelvis, I do not appreciate any obvious hydronephrosis or SBO, no obvious free air.  Please see radiologist interpretation.  Risk Prescription drug management. Risk Details: Patient she is resting, assessed in bed in no acute distress.  Recessing the abdomen is nontender without peritoneal signs.  I discussed workup and results in detail with patient and her partner Kelli Hunter today.  No evidence for kidney stone disease or appendicitis.  Low suspicion for other acute processes at this time.  Of note patient denies any concern for STI, low suspicion for PID or torsion at this point.  I encourage patient to follow-up with her primary care provider this week for recheck.  All questions answered.  Patient is stable at discharge, strict ER precautions discussed.      At this time there does not appear to be any evidence of an acute emergency medical condition and the patient appears stable for discharge with appropriate outpatient follow up. Diagnosis was discussed with patient who verbalizes understanding of care plan and is agreeable to discharge. I have discussed return  precautions with patient and partner who verbalizes understanding. Patient encouraged to follow-up with their PCP. All questions answered.     Note: Portions of this report may have been transcribed using voice recognition software. Every effort was made to ensure accuracy; however, inadvertent computerized transcription errors may still be present.         Final Clinical Impression(s) / ED Diagnoses Final diagnoses:  Flank pain    Rx / DC Orders ED Discharge Orders     None         Gari Crown 07/20/22 1816    Davonna Belling, MD 07/20/22 [phone]

## 2022-07-29 ENCOUNTER — Other Ambulatory Visit (HOSPITAL_BASED_OUTPATIENT_CLINIC_OR_DEPARTMENT_OTHER): Payer: Self-pay

## 2022-07-29 ENCOUNTER — Emergency Department (HOSPITAL_BASED_OUTPATIENT_CLINIC_OR_DEPARTMENT_OTHER)
Admission: EM | Admit: 2022-07-29 | Discharge: 2022-07-29 | Disposition: A | Discharge status: Home / Self Care | Payer: Medicaid Other | Attending: Emergency Medicine | Admitting: Emergency Medicine

## 2022-07-29 ENCOUNTER — Other Ambulatory Visit: Payer: Self-pay

## 2022-07-29 ENCOUNTER — Encounter (HOSPITAL_BASED_OUTPATIENT_CLINIC_OR_DEPARTMENT_OTHER): Payer: Self-pay

## 2022-07-29 ENCOUNTER — Emergency Department (HOSPITAL_BASED_OUTPATIENT_CLINIC_OR_DEPARTMENT_OTHER): Payer: Medicaid Other

## 2022-07-29 DIAGNOSIS — D72829 Elevated white blood cell count, unspecified: Secondary | ICD-10-CM | POA: Diagnosis not present

## 2022-07-29 DIAGNOSIS — R1110 Vomiting, unspecified: Secondary | ICD-10-CM | POA: Insufficient documentation

## 2022-07-29 DIAGNOSIS — R1031 Right lower quadrant pain: Secondary | ICD-10-CM | POA: Diagnosis present

## 2022-07-29 DIAGNOSIS — N132 Hydronephrosis with renal and ureteral calculous obstruction: Secondary | ICD-10-CM | POA: Insufficient documentation

## 2022-07-29 DIAGNOSIS — R197 Diarrhea, unspecified: Secondary | ICD-10-CM | POA: Diagnosis not present

## 2022-07-29 DIAGNOSIS — Z1152 Encounter for screening for COVID-19: Secondary | ICD-10-CM | POA: Diagnosis not present

## 2022-07-29 DIAGNOSIS — N200 Calculus of kidney: Secondary | ICD-10-CM

## 2022-07-29 LAB — URINALYSIS, W/ REFLEX TO CULTURE (INFECTION SUSPECTED)
Bacteria, UA: NONE SEEN
Bilirubin Urine: NEGATIVE
Glucose, UA: NEGATIVE mg/dL
Hgb urine dipstick: NEGATIVE
Ketones, ur: NEGATIVE mg/dL
Nitrite: NEGATIVE
Protein, ur: 30 mg/dL — AB
Specific Gravity, Urine: >1.046 — ABNORMAL HIGH (ref 1.005–1.030)
pH: 7.5 (ref 5.0–8.0)

## 2022-07-29 LAB — COMPREHENSIVE METABOLIC PANEL
ALT: 19 U/L (ref 0–44)
AST: 21 U/L (ref 15–41)
Albumin: 4.4 g/dL (ref 3.5–5.0)
Alkaline Phosphatase: 62 U/L (ref 38–126)
Anion gap: 10 (ref 5–15)
BUN: 10 mg/dL (ref 6–20)
CO2: 26 mmol/L (ref 22–32)
Calcium: 9.4 mg/dL (ref 8.9–10.3)
Chloride: 105 mmol/L (ref 98–111)
Creatinine, Ser: 0.88 mg/dL (ref 0.44–1.00)
GFR, Estimated: >60 mL/min (ref >60)
Glucose, Bld: 166 mg/dL — ABNORMAL HIGH (ref 70–99)
Potassium: 3.5 mmol/L (ref 3.5–5.1)
Sodium: 141 mmol/L (ref 135–145)
Total Bilirubin: 0.2 mg/dL — ABNORMAL LOW (ref 0.3–1.2)
Total Protein: 7.4 g/dL (ref 6.5–8.1)

## 2022-07-29 LAB — URINALYSIS, ROUTINE W REFLEX MICROSCOPIC
Bacteria, UA: NONE SEEN
Bilirubin Urine: NEGATIVE
Glucose, UA: NEGATIVE mg/dL
Ketones, ur: NEGATIVE mg/dL
Nitrite: NEGATIVE
Protein, ur: 30 mg/dL — AB
RBC / HPF: >50 RBC/hpf (ref 0–5)
Specific Gravity, Urine: 1.03 (ref 1.005–1.030)
pH: 7 (ref 5.0–8.0)

## 2022-07-29 LAB — CBC
HCT: 39.5 % (ref 36.0–46.0)
Hemoglobin: 13.2 g/dL (ref 12.0–15.0)
MCH: 30.3 pg (ref 26.0–34.0)
MCHC: 33.4 g/dL (ref 30.0–36.0)
MCV: 90.8 fL (ref 80.0–100.0)
Platelets: 314 10*3/uL (ref 150–400)
RBC: 4.35 MIL/uL (ref 3.87–5.11)
RDW: 11.8 % (ref 11.5–15.5)
WBC: 11.5 10*3/uL — ABNORMAL HIGH (ref 4.0–10.5)
nRBC: 0 % (ref 0.0–0.2)

## 2022-07-29 LAB — LIPASE, BLOOD: Lipase: 11 U/L (ref 11–51)

## 2022-07-29 LAB — HCG, SERUM, QUALITATIVE: Preg, Serum: NEGATIVE

## 2022-07-29 LAB — RESP PANEL BY RT-PCR (RSV, FLU A&B, COVID)  RVPGX2
Influenza A by PCR: NEGATIVE
Influenza B by PCR: NEGATIVE
Resp Syncytial Virus by PCR: NEGATIVE
SARS Coronavirus 2 by RT PCR: NEGATIVE

## 2022-07-29 LAB — PREGNANCY, URINE: Preg Test, Ur: NEGATIVE

## 2022-07-29 MED ORDER — IOHEXOL 300 MG/ML  SOLN
100.0000 mL | Freq: Once | INTRAMUSCULAR | Status: AC | PRN
  Administered 2022-07-29: 80 mL via INTRAVENOUS

## 2022-07-29 MED ORDER — MORPHINE SULFATE (PF) 4 MG/ML IV SOLN
4.0000 mg | Freq: Once | INTRAVENOUS | Status: AC
  Administered 2022-07-29: 4 mg via INTRAVENOUS
  Filled 2022-07-29: qty 1

## 2022-07-29 MED ORDER — CEPHALEXIN 500 MG PO CAPS
500.0000 mg | ORAL_CAPSULE | Freq: Two times a day (BID) | ORAL | 0 refills | Status: AC
  Filled 2022-07-29: qty 14, 7d supply, fill #0

## 2022-07-29 MED ORDER — ONDANSETRON HCL 4 MG/2ML IJ SOLN
INTRAMUSCULAR | Status: AC
  Filled 2022-07-29: qty 4

## 2022-07-29 MED ORDER — ONDANSETRON HCL 8 MG PO TABS
8.0000 mg | ORAL_TABLET | Freq: Three times a day (TID) | ORAL | 0 refills | Status: DC | PRN
  Filled 2022-07-29: qty 20, 7d supply, fill #0

## 2022-07-29 MED ORDER — SODIUM CHLORIDE 0.9 % IV SOLN
8.0000 mg | Freq: Once | INTRAVENOUS | Status: AC
  Administered 2022-07-29: 8 mg via INTRAVENOUS
  Filled 2022-07-29: qty 4

## 2022-07-29 MED ORDER — HYDROCODONE-ACETAMINOPHEN 5-325 MG PO TABS
1.0000 | ORAL_TABLET | Freq: Four times a day (QID) | ORAL | 0 refills | Status: DC | PRN
  Filled 2022-07-29: qty 10, 3d supply, fill #0

## 2022-07-29 MED ORDER — LACTATED RINGERS IV BOLUS
1000.0000 mL | Freq: Once | INTRAVENOUS | Status: AC
  Administered 2022-07-29: 1000 mL via INTRAVENOUS

## 2022-07-29 MED ORDER — KETOROLAC TROMETHAMINE 15 MG/ML IJ SOLN
15.0000 mg | Freq: Once | INTRAMUSCULAR | Status: AC
  Administered 2022-07-29: 15 mg via INTRAVENOUS
  Filled 2022-07-29: qty 1

## 2022-07-29 MED ORDER — TAMSULOSIN HCL 0.4 MG PO CAPS
0.8000 mg | ORAL_CAPSULE | Freq: Every day | ORAL | 0 refills | Status: AC
  Filled 2022-07-29: qty 30, 15d supply, fill #0

## 2022-07-29 NOTE — ED Provider Notes (Signed)
[facility] Provider Note   CSN: [id number] Arrival date & time: 07/29/22  [id number]     History  Chief Complaint  Patient presents with   Abdominal Pain    Kelli Hunter is a 22 y.o. female PMH of colitis and right sided kidney stone here for right lower abdominal pain. Seen in ED on 1/21 for flank pain. CT at that time showed no acute issues.  Since then had started to feel better but this morning woke up in pain.  She started vomiting and was very nauseous.  She said she started having diarrhea as well.  She says she does have some urinary urgency but she is unable to go more than a little bit.  There is slight amount of dysuria as well.  Denies any hematuria.  Denies any fevers.  She denies any vaginal discharge or bleeding.  Last menstrual period was last week.   Abdominal Pain Associated symptoms: diarrhea, dysuria and vomiting   Associated symptoms: no chest pain, no chills, no cough, no fever, no hematuria, no shortness of breath and no sore throat        Home Medications Prior to Admission medications   Medication Sig Start Date End Date Taking? Authorizing Provider  HYDROcodone-acetaminophen (NORCO) 5-325 MG tablet Take 1 tablet by mouth every 6 (six) hours as needed for severe pain. 07/29/22  Yes Gerrit Heck, MD  ondansetron (ZOFRAN) 8 MG tablet Take 1 tablet (8 mg total) by mouth every 8 (eight) hours as needed for nausea or vomiting. 07/29/22  Yes Gerrit Heck, MD  tamsulosin (FLOMAX) 0.4 MG CAPS capsule Take 2 capsules (0.8 mg total) by mouth daily for 15 days. 07/29/22 08/13/22 Yes Gerrit Heck, MD  amoxicillin-clavulanate (AUGMENTIN) 875-125 MG tablet Take 1 tablet by mouth every 12 (twelve) hours. 04/17/22   Deatra Canter, Amjad, PA-C  dicyclomine (BENTYL) 20 MG tablet Take 1 tablet (20 mg total) by mouth 2 (two) times daily. 04/17/22   Deatra Canter, Amjad, PA-C  fluticasone (FLONASE) 50 MCG/ACT nasal spray Place 1 spray into both nostrils  daily. Begin by using 2 sprays in each nare daily for 3 to 5 days, then decrease to 1 spray in each nare daily. 03/21/22   Lynden Oxford Scales, PA-C  loperamide (IMODIUM) 2 MG capsule Take 1 capsule (2 mg total) by mouth 4 (four) times daily as needed for diarrhea or loose stools. 04/16/22   Dorie Rank, MD  ondansetron (ZOFRAN-ODT) 8 MG disintegrating tablet Take 1 tablet (8 mg total) by mouth every 8 (eight) hours as needed for nausea or vomiting. 04/16/22   Dorie Rank, MD  VIENVA 0.1-20 MG-MCG tablet Take 1 tablet by mouth daily. 07/21/22   [provider]      Allergies    Patient has no known allergies.    Review of Systems   Review of Systems  Constitutional:  Positive for activity change. Negative for chills and fever.  HENT:  Negative for ear pain and sore throat.   Eyes:  Negative for pain and visual disturbance.  Respiratory:  Negative for cough and shortness of breath.   Cardiovascular:  Negative for chest pain and palpitations.  Gastrointestinal:  Positive for abdominal pain, diarrhea and vomiting. Negative for blood in stool.  Genitourinary:  Positive for difficulty urinating, dysuria and urgency. Negative for frequency and hematuria.  Musculoskeletal:  Negative for arthralgias and back pain.  Skin:  Negative for color change and rash.  Neurological:  Negative for seizures and syncope.  All other systems reviewed and are negative.   Physical Exam Updated Vital Signs BP 104/62 (BP Location: Right Arm)   Pulse 96   Temp 98.1 F (36.7 C) (Oral)   Resp 16   Ht 5' 2"$  (1.575 m)   Wt 77.1 kg   LMP 07/17/2022 (Exact Date)   SpO2 99%   BMI 31.09 kg/m  Physical Exam Vitals and nursing note reviewed.  Constitutional:      Appearance: She is well-developed.     Comments: Appears to be in acute pain  HENT:     Head: Normocephalic and atraumatic.     Mouth/Throat:     Mouth: Mucous membranes are moist.  Eyes:     Conjunctiva/sclera: Conjunctivae normal.   Cardiovascular:     Rate and Rhythm: Normal rate and regular rhythm.     Heart sounds: No murmur heard. Pulmonary:     Effort: Pulmonary effort is normal. No respiratory distress.     Breath sounds: Normal breath sounds.  Abdominal:     General: Abdomen is flat. Bowel sounds are normal. There is no distension.     Palpations: Abdomen is soft.     Tenderness: There is no abdominal tenderness. There is no guarding or rebound. Negative signs include Rovsing's sign, McBurney's sign and psoas sign.     Comments: No reproducible abdominal pain on my exam  Musculoskeletal:        General: No swelling.     Cervical back: Neck supple.  Skin:    General: Skin is warm and dry.     Capillary Refill: Capillary refill takes less than 2 seconds.  Neurological:     General: No focal deficit present.     Mental Status: She is alert.  Psychiatric:        Mood and Affect: Mood normal.     ED Results / Procedures / Treatments   Labs (all labs ordered are listed, but only abnormal results are displayed) Labs Reviewed  COMPREHENSIVE METABOLIC PANEL - Abnormal; Notable for the following components:      Result Value   Glucose, Bld 166 (*)    Total Bilirubin 0.2 (*)    All other components within normal limits  CBC - Abnormal; Notable for the following components:   WBC 11.5 (*)    All other components within normal limits  URINALYSIS, ROUTINE W REFLEX MICROSCOPIC - Abnormal; Notable for the following components:   APPearance HAZY (*)    Hgb urine dipstick TRACE (*)    Protein, ur 30 (*)    Leukocytes,Ua SMALL (*)    All other components within normal limits  RESP PANEL BY RT-PCR (RSV, FLU A&B, COVID)  RVPGX2  LIPASE, BLOOD  PREGNANCY, URINE  HCG, SERUM, QUALITATIVE  URINALYSIS, W/ REFLEX TO CULTURE (INFECTION SUSPECTED)  GC/CHLAMYDIA PROBE AMP ([place]) NOT AT [facility]    EKG None  Radiology CT ABDOMEN PELVIS W CONTRAST  Result Date: 07/29/2022 CLINICAL DATA:  Acute right lower  quadrant abdominal pain. EXAM: CT ABDOMEN AND PELVIS WITH CONTRAST TECHNIQUE: Multidetector CT imaging of the abdomen and pelvis was performed using the standard protocol following bolus administration of intravenous contrast. RADIATION DOSE REDUCTION: This exam was performed according to the departmental dose-optimization program which includes automated exposure control, adjustment of the mA and/or kV according to patient size and/or use of iterative reconstruction technique. CONTRAST:  61m OMNIPAQUE IOHEXOL 300 MG/ML  SOLN COMPARISON:  July 20, 2022. FINDINGS: Lower chest: No acute abnormality. Hepatobiliary: No focal  liver abnormality is seen. No gallstones, gallbladder wall thickening, or biliary dilatation. Pancreas: Unremarkable. No pancreatic ductal dilatation or surrounding inflammatory changes. Spleen: Normal in size without focal abnormality. Adrenals/Urinary Tract: Adrenal glands appear normal. Mild right hydroureteronephrosis is noted secondary to 2 mm calculus at the right ureterovesical junction. Urinary bladder is decompressed. Left kidney and ureter are unremarkable. Stomach/Bowel: Stomach is within normal limits. Appendix appears normal. No evidence of bowel wall thickening, distention, or inflammatory changes. Vascular/Lymphatic: No significant vascular findings are present. No enlarged abdominal or pelvic lymph nodes. Reproductive: Uterus and bilateral adnexa are unremarkable. Other: No abdominal wall hernia or abnormality. No abdominopelvic ascites. Musculoskeletal: No acute or significant osseous findings. IMPRESSION: Mild right hydroureteronephrosis is noted secondary to 2 mm calculus at the right ureterovesical junction. Electronically Signed   By: Marijo Conception M.D.   On: 07/29/2022 12:03   US PELVIC COMPLETE W TRANSVAGINAL AND TORSION R/O  Result Date: 07/29/2022 CLINICAL DATA:  Abdominal pain, unknown LMP, G0 EXAM: TRANSABDOMINAL AND TRANSVAGINAL ULTRASOUND OF PELVIS DOPPLER  ULTRASOUND OF OVARIES TECHNIQUE: Both transabdominal and transvaginal ultrasound examinations of the pelvis were performed. Transabdominal technique was performed for global imaging of the pelvis including uterus, ovaries, adnexal regions, and pelvic cul-de-sac. It was necessary to proceed with endovaginal exam following the transabdominal exam to visualize the uterus and endometrium. Color and duplex Doppler ultrasound was utilized to evaluate blood flow to the ovaries. COMPARISON:  None Available. FINDINGS: Uterus Measurements: 6.5 x 3.5 x 3.9 cm = volume: 47 mL. Anteverted. Heterogeneous myometrium. No focal mass. Endometrium Thickness: 5 mm.  No endometrial fluid or mass Right ovary Measurements: 1.7 x 2.7 x 1.5 cm = volume: 3.5 mL. Normal morphology without mass Left ovary Measurements: 2.7 x 1.7 x 2.5 cm = volume: 4.7 mL. Normal morphology without mass Pulsed Doppler evaluation of both ovaries demonstrates normal low-resistance arterial and venous waveforms. Other findings No free pelvic fluid or adnexal masses. IMPRESSION: Normal exam. Electronically Signed   By: Lavonia Dana M.D.   On: 07/29/2022 08:24    Procedures Procedures   Medications Ordered in ED Medications  ondansetron (ZOFRAN) 4 MG/2ML injection (has no administration in time range)  ondansetron (ZOFRAN) 8 mg in sodium chloride 0.9 % 50 mL IVPB (0 mg Intravenous Stopped 07/29/22 0810)  morphine (PF) 4 MG/ML injection 4 mg (4 mg Intravenous Given 07/29/22 0750)  lactated ringers bolus 1,000 mL (0 mLs Intravenous Stopped 07/29/22 1302)  ketorolac (TORADOL) 15 MG/ML injection 15 mg (15 mg Intravenous Given 07/29/22 1158)  iohexol (OMNIPAQUE) 300 MG/ML solution 100 mL (80 mLs Intravenous Contrast Given 07/29/22 1146)    ED Course/ Medical Decision Making/ A&P                            Medical Decision Making  Medical Decision Making:   Kelli Hunter is a 22 y.o. female who presented to the ED today with right lower quadrant pain,  vomiting and diarrhea detailed above.    Additional history discussed with patient's family/caregivers.  Patient's presentation is complicated by their history of renal stone and colitis.  Complete initial physical exam performed, notably the patient  was in significant pain although no reproducible tenderness on abdominal examination.    Reviewed and confirmed nursing documentation for past medical history, family history, social history.    Initial Assessment:   With the patient's presentation of right lower quadrant abdominal pain, most likely diagnosis is kidney stone. Other  diagnoses were considered including (but not limited to) ovarian torsion, appendicitis, pyelonephritis, renal stone, colitis, PID  Initial Plan:  Pelvic ultrasound w/ r/o torsion Screening labs including CBC and Metabolic panel to evaluate for infectious or metabolic etiology of disease.  Urinalysis with reflex culture ordered to evaluate for UTI or relevant urologic/nephrologic pathology.  Upreg Lipase Urine g/c Objective evaluation as below reviewed   Initial Study Results:   Laboratory  All laboratory results reviewed without evidence of clinically relevant pathology.  Urine pregnancy and serum negative.  CMP overall within normal limits.  Lipase normal.  Flu/COVID-negative Exceptions include: UA with small leukocytes, trace hemoglobin.  CBC with mild leukocytosis.  Radiology:  All images reviewed independently. Agree with radiology report at this time.   CT ABDOMEN PELVIS W CONTRAST  Result Date: 07/29/2022 CLINICAL DATA:  Acute right lower quadrant abdominal pain. EXAM: CT ABDOMEN AND PELVIS WITH CONTRAST TECHNIQUE: Multidetector CT imaging of the abdomen and pelvis was performed using the standard protocol following bolus administration of intravenous contrast. RADIATION DOSE REDUCTION: This exam was performed according to the departmental dose-optimization program which includes automated exposure control,  adjustment of the mA and/or kV according to patient size and/or use of iterative reconstruction technique. CONTRAST:  50m OMNIPAQUE IOHEXOL 300 MG/ML  SOLN COMPARISON:  July 20, 2022. FINDINGS: Lower chest: No acute abnormality. Hepatobiliary: No focal liver abnormality is seen. No gallstones, gallbladder wall thickening, or biliary dilatation. Pancreas: Unremarkable. No pancreatic ductal dilatation or surrounding inflammatory changes. Spleen: Normal in size without focal abnormality. Adrenals/Urinary Tract: Adrenal glands appear normal. Mild right hydroureteronephrosis is noted secondary to 2 mm calculus at the right ureterovesical junction. Urinary bladder is decompressed. Left kidney and ureter are unremarkable. Stomach/Bowel: Stomach is within normal limits. Appendix appears normal. No evidence of bowel wall thickening, distention, or inflammatory changes. Vascular/Lymphatic: No significant vascular findings are present. No enlarged abdominal or pelvic lymph nodes. Reproductive: Uterus and bilateral adnexa are unremarkable. Other: No abdominal wall hernia or abnormality. No abdominopelvic ascites. Musculoskeletal: No acute or significant osseous findings. IMPRESSION: Mild right hydroureteronephrosis is noted secondary to 2 mm calculus at the right ureterovesical junction. Electronically Signed   By: JMarijo ConceptionM.D.   On: 07/29/2022 12:03   [place] COMPLETE W TRANSVAGINAL AND TORSION R/O  Result Date: 07/29/2022 CLINICAL DATA:  Abdominal pain, unknown LMP, G0 EXAM: TRANSABDOMINAL AND TRANSVAGINAL ULTRASOUND OF PELVIS DOPPLER ULTRASOUND OF OVARIES TECHNIQUE: Both transabdominal and transvaginal ultrasound examinations of the pelvis were performed. Transabdominal technique was performed for global imaging of the pelvis including uterus, ovaries, adnexal regions, and pelvic cul-de-sac. It was necessary to proceed with endovaginal exam following the transabdominal exam to visualize the uterus and  endometrium. Color and duplex Doppler ultrasound was utilized to evaluate blood flow to the ovaries. COMPARISON:  None Available. FINDINGS: Uterus Measurements: 6.5 x 3.5 x 3.9 cm = volume: 47 mL. Anteverted. Heterogeneous myometrium. No focal mass. Endometrium Thickness: 5 mm.  No endometrial fluid or mass Right ovary Measurements: 1.7 x 2.7 x 1.5 cm = volume: 3.5 mL. Normal morphology without mass Left ovary Measurements: 2.7 x 1.7 x 2.5 cm = volume: 4.7 mL. Normal morphology without mass Pulsed Doppler evaluation of both ovaries demonstrates normal low-resistance arterial and venous waveforms. Other findings No free pelvic fluid or adnexal masses. IMPRESSION: Normal exam. Electronically Signed   By: MLavonia DanaM.D.   On: 07/29/2022 08:24   CT ABDOMEN PELVIS W CONTRAST  Result Date: 07/20/2022 CLINICAL DATA:  Right lower  quadrant abdominal pain. EXAM: CT ABDOMEN AND PELVIS WITH CONTRAST TECHNIQUE: Multidetector CT imaging of the abdomen and pelvis was performed using the standard protocol following bolus administration of intravenous contrast. RADIATION DOSE REDUCTION: This exam was performed according to the departmental dose-optimization program which includes automated exposure control, adjustment of the mA and/or kV according to patient size and/or use of iterative reconstruction technique. CONTRAST:  165m OMNIPAQUE IOHEXOL 300 MG/ML  SOLN COMPARISON:  04/17/2022 FINDINGS: Lower chest: Unremarkable. Hepatobiliary: No suspicious focal abnormality within the liver parenchyma. There is no evidence for gallstones, gallbladder wall thickening, or pericholecystic fluid. No intrahepatic or extrahepatic biliary dilation. Pancreas: No focal mass lesion. No dilatation of the main duct. No intraparenchymal cyst. No peripancreatic edema. Spleen: No splenomegaly. No focal mass lesion. Adrenals/Urinary Tract: No adrenal nodule or mass. Punctate nonobstructing stones are seen in the lower pole right kidney (best seen  on coronal imaging). Left kidney unremarkable. No evidence for hydroureter. The urinary bladder appears normal for the degree of distention. Stomach/Bowel: Stomach is unremarkable. No gastric wall thickening. No evidence of outlet obstruction. Duodenum is normally positioned as is the ligament of Treitz. No small bowel wall thickening. No small bowel dilatation. The terminal ileum is normal. The appendix is normal. No gross colonic mass. No colonic wall thickening. Vascular/Lymphatic: No abdominal aortic aneurysm. There is no gastrohepatic or hepatoduodenal ligament lymphadenopathy. No retroperitoneal or mesenteric lymphadenopathy. No pelvic sidewall lymphadenopathy. Reproductive: The uterus is unremarkable.  There is no adnexal mass. Other: No intraperitoneal free fluid. Musculoskeletal: No worrisome lytic or sclerotic osseous abnormality. IMPRESSION: No acute findings in the abdomen or pelvis. Specifically, no findings to explain the patient's history of right lower quadrant pain. Punctate nonobstructing stones identified lower pole right kidney Electronically Signed   By: EMisty StanleyM.D.   On: 07/20/2022 17:34     Final Assessment and Plan:   22yo female here with RLQ pain found to have 2 mm renal stone on CT abdomen.  Ovarian torsion ruled out.  Patient remained hemodynamically stable and had pain relief at end of ED stay.  Urine did show some leukocytes although no bacteria and did have squamous cells present on urinalysis so possibly dirty sample.  Regardless we will send in Keflex 500 twice daily to cover for possibility of infected stone and send a culture as well.  If culture negative may stop the Keflex.  Discussed with patient to follow-up with urology in 2 days. Discharged home with symptomatic measures, ,NSAIDs first line-norco for breakthrough, zofran and keflex.   Clinical Impression:  1. Renal stone      Discharge   Final Clinical Impression(s) / ED Diagnoses Final diagnoses:   Renal stone    Rx / DC Orders ED Discharge Orders          Ordered    HYDROcodone-acetaminophen (NORCO) 5-325 MG tablet  Every 6 hours PRN        07/29/22 1301    ondansetron (ZOFRAN) 8 MG tablet  Every 8 hours PRN        07/29/22 1303    tamsulosin (FLOMAX) 0.4 MG CAPS capsule  Daily        07/29/22 1304              JGerrit Heck MD 07/29/22 1314    ZElnora Morrison MD 08/08/22 2317

## 2022-07-29 NOTE — ED Triage Notes (Signed)
Pov from home, c/o lower right quad pain that began approx 2 hours ago, sts "feels like i have to pee and poop but can't". Sts stabbing and aching pain. Endorses nausea and vomiting x 5. Amb to room, A&O x 4, GCS 15.

## 2022-07-29 NOTE — ED Triage Notes (Signed)
Sts this pain happened one week ago and dx with kidney stones

## 2022-07-29 NOTE — ED Notes (Signed)
Patient verbalizes understanding of discharge instructions. Opportunity for questioning and answers were provided. Patient discharged from ED.  °

## 2022-07-29 NOTE — Discharge Instructions (Addendum)
Your scan was negative for ovarian torsion It does look like you have a 2 mm stone in the right ureter-this is the most likely cause of your pain We will give you flomax to help get this stone out and please drink lots of fluids We will send you with some pain medications as well - please do 600 mg NSAID every 6 hours as needed for pain- you may use norco tablet for breakthrough pain I am going to send keflex to take twice a day-if your culture is negative we will tell you to stop this. Please follow up with urology in 2 days as indicated above Zofran as needed for nausea

## 2022-07-30 LAB — GC/CHLAMYDIA PROBE AMP (~~LOC~~) NOT AT ARMC
Chlamydia: NEGATIVE
Comment: NEGATIVE
Comment: NORMAL
Neisseria Gonorrhea: NEGATIVE

## 2022-08-10 ENCOUNTER — Telehealth: Payer: Medicaid Other | Admitting: Nurse Practitioner

## 2022-08-10 DIAGNOSIS — R6889 Other general symptoms and signs: Secondary | ICD-10-CM | POA: Diagnosis not present

## 2022-08-10 MED ORDER — OSELTAMIVIR PHOSPHATE 75 MG PO CAPS: 75.0000 mg | ORAL_CAPSULE | Freq: Two times a day (BID) | ORAL | 0 refills | Status: AC

## 2022-08-10 NOTE — Progress Notes (Signed)
Virtual Visit Consent   Kelli Hunter, you are scheduled for a virtual visit with a [facility] [place] provider today. Just as with appointments in the office, your consent must be obtained to participate. Your consent will be active for this visit and any virtual visit you may have with one of our providers in the next 365 days. If you have a MyChart account, a copy of this consent can be sent to you electronically.  As this is a virtual visit, video technology does not allow for your provider to perform a traditional examination. This may limit your provider's ability to fully assess your condition. If your provider identifies any concerns that need to be evaluated in person or the need to arrange testing (such as labs, EKG, etc.), we will make arrangements to do so. Although advances in technology are sophisticated, we cannot ensure that it will always work on either your end or our end. If the connection with a video visit is poor, the visit may have to be switched to a telephone visit. With either a video or telephone visit, we are not always able to ensure that we have a secure connection.  By engaging in this virtual visit, you consent to the provision of healthcare and authorize for your insurance to be billed (if applicable) for the services provided during this visit. Depending on your insurance coverage, you may receive a charge related to this service.  I need to obtain your verbal consent now. Are you willing to proceed with your visit today? Kelli Hunter has provided verbal consent on 08/10/2022 for a virtual visit (video or telephone). Gildardo Pounds, NP  Date: 08/10/2022 8:55 AM  Virtual Visit via Video Note   I, Gildardo Pounds, connected with  Kelli Hunter  ([id number], 06-01-2001) on 08/10/22 at  8:45 AM EST by a video-enabled telemedicine application and verified that I am speaking with the correct person using two identifiers.  Location: Patient: Virtual Visit Location Patient:  Home Provider: Virtual Visit Location Provider: Home Office   I discussed the limitations of evaluation and management by telemedicine and the availability of in person appointments. The patient expressed understanding and agreed to proceed.    History of Present Illness: Kelli Hunter is a 22 y.o. who identifies as a female who was assigned female at birth, and is being seen today for Positive influenza.  [name] states she took a flu test and it was positive. Currently experiencing the following symptoms over the past 2 days: Nasal congestion, cough, sore throat, fever, "lungs are inflamed",    Problems: There are no problems to display for this patient.   Allergies: No Known Allergies Medications:  Current Outpatient Medications:    oseltamivir (TAMIFLU) 75 MG capsule, Take 1 capsule (75 mg total) by mouth 2 (two) times daily for 5 days., Disp: 10 capsule, Rfl: 0   HYDROcodone-acetaminophen (NORCO) 5-325 MG tablet, Take 1 tablet by mouth every 6 (six) hours as needed for severe pain., Disp: 10 tablet, Rfl: 0   ondansetron (ZOFRAN) 8 MG tablet, Take 1 tablet (8 mg total) by mouth every 8 (eight) hours as needed for nausea or vomiting., Disp: 20 tablet, Rfl: 0   ondansetron (ZOFRAN-ODT) 8 MG disintegrating tablet, Take 1 tablet (8 mg total) by mouth every 8 (eight) hours as needed for nausea or vomiting., Disp: 12 tablet, Rfl: 0   tamsulosin (FLOMAX) 0.4 MG CAPS capsule, Take 2 capsules (0.8 mg total) by mouth daily for 15 days., Disp: 30 capsule,  Rfl: 0   VIENVA 0.1-20 MG-MCG tablet, Take 1 tablet by mouth daily., Disp: , Rfl:   Observations/Objective: Patient is well-developed, well-nourished in no acute distress.  Resting comfortably at home.  Head is normocephalic, atraumatic.  No labored breathing.  Speech is clear and coherent with logical content.  Patient is alert and oriented at baseline.    Assessment and Plan: 1. Flu-like symptoms - oseltamivir (TAMIFLU) 75 MG  capsule; Take 1 capsule (75 mg total) by mouth 2 (two) times daily for 5 days.  Dispense: 10 capsule; Refill: 0  INSTRUCTIONS: use a humidifier for nasal congestion Drink plenty of fluids, rest and wash hands frequently to avoid the spread of infection Alternate tylenol and Motrin for relief of fever   Follow Up Instructions: I discussed the assessment and treatment plan with the patient. The patient was provided an opportunity to ask questions and all were answered. The patient agreed with the plan and demonstrated an understanding of the instructions.  A copy of instructions were sent to the patient via MyChart unless otherwise noted below.     The patient was advised to call back or seek an in-person evaluation if the symptoms worsen or if the condition fails to improve as anticipated.  Time:  I spent 11 minutes with the patient via telehealth technology discussing the above problems/concerns.    Gildardo Pounds, NP

## 2022-08-10 NOTE — Patient Instructions (Signed)
  Wendie Agreste, thank you for joining Gildardo Pounds, NP for today's virtual visit.  While this provider is not your primary care provider (PCP), if your PCP is located in our provider database this encounter information will be shared with them immediately following your visit.   [facility] account gives you access to today's visit and all your visits, tests, and labs performed at [facility] " click here if you don't have a [facility] account or go to [email or web address]  Consent: (Patient) Kelli Hunter provided verbal consent for this virtual visit at the beginning of the encounter.  Current Medications:  Current Outpatient Medications:    oseltamivir (TAMIFLU) 75 MG capsule, Take 1 capsule (75 mg total) by mouth 2 (two) times daily for 5 days., Disp: 10 capsule, Rfl: 0   HYDROcodone-acetaminophen (NORCO) 5-325 MG tablet, Take 1 tablet by mouth every 6 (six) hours as needed for severe pain., Disp: 10 tablet, Rfl: 0   ondansetron (ZOFRAN) 8 MG tablet, Take 1 tablet (8 mg total) by mouth every 8 (eight) hours as needed for nausea or vomiting., Disp: 20 tablet, Rfl: 0   ondansetron (ZOFRAN-ODT) 8 MG disintegrating tablet, Take 1 tablet (8 mg total) by mouth every 8 (eight) hours as needed for nausea or vomiting., Disp: 12 tablet, Rfl: 0   tamsulosin (FLOMAX) 0.4 MG CAPS capsule, Take 2 capsules (0.8 mg total) by mouth daily for 15 days., Disp: 30 capsule, Rfl: 0   VIENVA 0.1-20 MG-MCG tablet, Take 1 tablet by mouth daily., Disp: , Rfl:    Medications ordered in this encounter:  Meds ordered this encounter  Medications   oseltamivir (TAMIFLU) 75 MG capsule    Sig: Take 1 capsule (75 mg total) by mouth 2 (two) times daily for 5 days.    Dispense:  10 capsule    Refill:  0    Order Specific Question:   Supervising Provider    Answer:   Chase Picket [id number]     *If you need refills on other medications prior to your next appointment, please  contact your pharmacy*  Follow-Up: Call back or seek an in-person evaluation if the symptoms worsen or if the condition fails to improve as anticipated.  [place] [phone]  Other Instructions INSTRUCTIONS: use a humidifier for nasal congestion Drink plenty of fluids, rest and wash hands frequently to avoid the spread of infection Alternate tylenol and Motrin for relief of fever    If you have been instructed to have an in-person evaluation today at a local Urgent Care facility, please use the link below. It will take you to a list of all of our available [facility], including address, phone number and hours of operation. Please do not delay care.  [facility]  If you or a family member do not have a primary care provider, use the link below to schedule a visit and establish care. When you choose a [facility] primary care physician or advanced practice provider, you gain a long-term partner in health. Find a Primary Care Provider  Learn more about [facility]'s in-office and virtual care options: [facility]

## 2022-10-10 ENCOUNTER — Ambulatory Visit
Admission: RE | Admit: 2022-10-10 | Discharge: 2022-10-10 | Disposition: A | Discharge status: Home / Self Care | Payer: Medicaid Other | Source: Ambulatory Visit | Attending: Emergency Medicine | Admitting: Emergency Medicine

## 2022-10-10 VITALS — BP 106/73 | HR 66 | Temp 98.0°F | Resp 17

## 2022-10-10 DIAGNOSIS — J302 Other seasonal allergic rhinitis: Secondary | ICD-10-CM

## 2022-10-10 DIAGNOSIS — H6993 Unspecified Eustachian tube disorder, bilateral: Secondary | ICD-10-CM

## 2022-10-10 MED ORDER — CETIRIZINE HCL 10 MG PO TABS: 10.0000 mg | ORAL_TABLET | Freq: Every day | ORAL | 1 refills | Status: AC

## 2022-10-10 MED ORDER — AZELASTINE-FLUTICASONE 137-50 MCG/ACT NA SUSP: 1.0000 | Freq: Two times a day (BID) | NASAL | 2 refills | Status: AC

## 2022-10-10 NOTE — Discharge Instructions (Signed)
Your symptoms and my physical exam findings are concerning for exacerbation of your underlying allergies.     To avoid catching frequent respiratory infections, having skin reactions, dealing with eye irritation, losing sleep, missing work, etc., due to uncontrolled allergies, it is important that you begin/continue your allergy regimen and are consistent with taking your meds exactly as prescribed.   Please read below to learn more about the medications, dosages and frequencies that I recommend to help alleviate your symptoms and to get you feeling better soon:   Zyrtec (cetirizine): This is an excellent second-generation antihistamine that helps to reduce respiratory inflammatory response to environmental allergens.  In some patients, this medication can cause daytime sleepiness so I recommend that you take 1 tablet daily at bedtime.     Dymista (fluticasone and azelastine): This is a combination nasal spray that contains both a nasal steroid and nasal antihistamine.  Please use 1 spray in each nare twice daily or every 12 hours.  This medication works best when it is used regularly, not "as needed".  You may find that it takes a few days for this to reach full effectiveness, please be patient with his onboarding process.  The most common side effect of this medication is nosebleeds.  Please discontinue use for 1 week if this occurs, then resume.   If symptoms have not meaningfully improved in the next 5 to 7 days, please return for repeat evaluation or follow-up with your regular provider.  If symptoms have worsened in the next 3 to 5 days, please return for repeat evaluation or follow-up with your regular provider.    Thank you for visiting urgent care today.  We appreciate the opportunity to participate in your care.

## 2022-10-10 NOTE — ED Provider Notes (Signed)
[facility]    CSN: [id number] Arrival date & time: 10/10/22  1528    HISTORY   Chief Complaint  Patient presents with   Ear Fullness    Entered by patient   HPI Kelli Hunter is a pleasant, 22 y.o. female who presents to urgent care today. Pt presents with bilateral ear fullness for past few days.   The history is provided by the patient.   History reviewed. No pertinent past medical history. There are no problems to display for this patient.  History reviewed. No pertinent surgical history. OB History   No obstetric history on file.    Home Medications    Prior to Admission medications   Medication Sig Start Date End Date Taking? Authorizing Provider  HYDROcodone-acetaminophen (NORCO) 5-325 MG tablet Take 1 tablet by mouth every 6 (six) hours as needed for severe pain. 07/29/22   Levin Erp, MD  ondansetron (ZOFRAN) 8 MG tablet Take 1 tablet (8 mg total) by mouth every 8 (eight) hours as needed for nausea or vomiting. 07/29/22   Levin Erp, MD  ondansetron (ZOFRAN-ODT) 8 MG disintegrating tablet Take 1 tablet (8 mg total) by mouth every 8 (eight) hours as needed for nausea or vomiting. 04/16/22   Linwood Dibbles, MD  VIENVA 0.1-20 MG-MCG tablet Take 1 tablet by mouth daily. 07/21/22   [provider]    Family History Family History  Family history unknown: Yes   Social History Social History   Tobacco Use   Smoking status: Former   Smokeless tobacco: Never  Building services engineer Use: Some days   Substances: Nicotine  Substance Use Topics   Alcohol use: No    Alcohol/week: 0.0 standard drinks of alcohol   Drug use: Yes    Types: Marijuana   Allergies   Patient has no known allergies.  Review of Systems Review of Systems Pertinent findings revealed after performing a 14 point review of systems has been noted in the history of present illness.  Physical Exam Vital Signs BP 106/73 (BP Location: Right Arm)   Pulse 66   Temp 98  F (36.7 C) (Oral)   Resp 17   SpO2 98%   No data found.  Physical Exam Vitals and nursing note reviewed.  Constitutional:      General: She is not in acute distress.    Appearance: Normal appearance. She is not ill-appearing.  HENT:     Head: Normocephalic and atraumatic.     Salivary Glands: Right salivary gland is not diffusely enlarged or tender. Left salivary gland is not diffusely enlarged or tender.     Right Ear: Ear canal and external ear normal. No drainage. A middle ear effusion is present. There is no impacted cerumen. Tympanic membrane is bulging. Tympanic membrane is not injected or erythematous.     Left Ear: Ear canal and external ear normal. No drainage. A middle ear effusion is present. There is no impacted cerumen. Tympanic membrane is bulging. Tympanic membrane is not injected or erythematous.     Ears:     Comments: Bilateral EACs normal, both TMs bulging with clear fluid    Nose: Rhinorrhea present. No nasal deformity, septal deviation, signs of injury, nasal tenderness, mucosal edema or congestion. Rhinorrhea is clear.     Right Nostril: Occlusion present. No foreign body, epistaxis or septal hematoma.     Left Nostril: Occlusion present. No foreign body, epistaxis or septal hematoma.     Right Turbinates: Enlarged, swollen and  pale.     Left Turbinates: Enlarged, swollen and pale.     Right Sinus: No maxillary sinus tenderness or frontal sinus tenderness.     Left Sinus: No maxillary sinus tenderness or frontal sinus tenderness.     Mouth/Throat:     Lips: Pink. No lesions.     Mouth: Mucous membranes are moist. No oral lesions.     Pharynx: Oropharynx is clear. Uvula midline. No posterior oropharyngeal erythema or uvula swelling.     Tonsils: No tonsillar exudate. 0 on the right. 0 on the left.     Comments: Postnasal drip Eyes:     General: Lids are normal.        Right eye: No discharge.        Left eye: No discharge.     Extraocular Movements:  Extraocular movements intact.     Conjunctiva/sclera: Conjunctivae normal.     Right eye: Right conjunctiva is not injected.     Left eye: Left conjunctiva is not injected.  Neck:     Trachea: Trachea and phonation normal.  Cardiovascular:     Rate and Rhythm: Normal rate and regular rhythm.     Pulses: Normal pulses.     Heart sounds: Normal heart sounds. No murmur heard.    No friction rub. No gallop.  Pulmonary:     Effort: Pulmonary effort is normal. No accessory muscle usage, prolonged expiration or respiratory distress.     Breath sounds: Normal breath sounds. No stridor, decreased air movement or transmitted upper airway sounds. No decreased breath sounds, wheezing, rhonchi or rales.  Chest:     Chest wall: No tenderness.  Musculoskeletal:        General: Normal range of motion.     Cervical back: Normal range of motion and neck supple. Normal range of motion.  Lymphadenopathy:     Cervical: No cervical adenopathy.  Skin:    General: Skin is warm and dry.     Findings: No erythema or rash.  Neurological:     General: No focal deficit present.     Mental Status: She is alert and oriented to person, place, and time.  Psychiatric:        Mood and Affect: Mood normal.        Behavior: Behavior normal.     Visual Acuity Right Eye Distance:   Left Eye Distance:   Bilateral Distance:    Right Eye Near:   Left Eye Near:    Bilateral Near:     UC Couse / Diagnostics / Procedures:     Radiology No results found.  Procedures Procedures (including critical care time) EKG  Pending results:  Labs Reviewed - No data to display  Medications Ordered in UC: Medications - No data to display  UC Diagnoses / Final Clinical Impressions(s)   I have reviewed the triage vital signs and the nursing notes.  Pertinent labs & imaging results that were available during my care of the patient were reviewed by me and considered in my medical decision making (see chart for  details).    Final diagnoses:  Eustachian tube dysfunction, bilateral  Seasonal allergic rhinitis, unspecified trigger   *** Please see discharge instructions below for further details of plan of care as provided to patient. ED Prescriptions     Medication Sig Dispense Auth. Provider   cetirizine (ZYRTEC ALLERGY) 10 MG tablet Take 1 tablet (10 mg total) by mouth at bedtime. 90 tablet Theadora Rama Scales, [place]  Azelastine-Fluticasone (DYMISTA) 137-50 MCG/ACT SUSP Place 1 spray into the nose every 12 (twelve) hours. 23 g Theadora Rama Scales, PA-C      I have reviewed the PDMP during this encounter.  Disposition Upon Discharge:  Condition: stable for discharge home Home: take medications as prescribed; routine discharge instructions as discussed; follow up as advised.  Patient presented with an acute illness with associated systemic symptoms and significant discomfort requiring urgent management. In my opinion, this is a condition that a prudent lay person (someone who possesses an average knowledge of health and medicine) may potentially expect to result in complications if not addressed urgently such as respiratory distress, impairment of bodily function or dysfunction of bodily organs.   Routine symptom specific, illness specific and/or disease specific instructions were discussed with the patient and/or caregiver at length.   As such, the patient has been evaluated and assessed, work-up was performed and treatment was provided in alignment with urgent care protocols and evidence based medicine.  Patient/parent/caregiver has been advised that the patient may require follow up for further testing and treatment if the symptoms continue in spite of treatment, as clinically indicated and appropriate.  If the patient was tested for COVID-19, Influenza and/or RSV, then the patient/parent/guardian was advised to isolate at home pending the results of his/her diagnostic coronavirus test  and potentially longer if they're positive. I have also advised pt that if his/her COVID-19 test returns positive, it's recommended to self-isolate for at least 10 days after symptoms first appeared AND until fever-free for 24 hours without fever reducer AND other symptoms have improved or resolved. Discussed self-isolation recommendations as well as instructions for household member/close contacts as per the [facility] and [place] DHHS, and also gave patient the COVID packet with this information.  Patient/parent/caregiver has been advised to return to the [facility] or PCP in 3-5 days if no better; to PCP or the Emergency Department if new signs and symptoms develop, or if the current signs or symptoms continue to change or worsen for further workup, evaluation and treatment as clinically indicated and appropriate  The patient will follow up with their current PCP if and as advised. If the patient does not currently have a PCP we will assist them in obtaining one.   The patient may need specialty follow up if the symptoms continue, in spite of conservative treatment and management, for further workup, evaluation, consultation and treatment as clinically indicated and appropriate.  Patient/parent/caregiver verbalized understanding and agreement of plan as discussed.  All questions were addressed during visit.  Please see discharge instructions below for further details of plan.  Discharge Instructions:   Discharge Instructions      Your symptoms and my physical exam findings are concerning for exacerbation of your underlying allergies.     To avoid catching frequent respiratory infections, having skin reactions, dealing with eye irritation, losing sleep, missing work, etc., due to uncontrolled allergies, it is important that you begin/continue your allergy regimen and are consistent with taking your meds exactly as prescribed.   Please read below to learn more about the medications, dosages and frequencies that I  recommend to help alleviate your symptoms and to get you feeling better soon:   Zyrtec (cetirizine): This is an excellent second-generation antihistamine that helps to reduce respiratory inflammatory response to environmental allergens.  In some patients, this medication can cause daytime sleepiness so I recommend that you take 1 tablet daily at bedtime.     Dymista (fluticasone and azelastine): This is a  combination nasal spray that contains both a nasal steroid and nasal antihistamine.  Please use 1 spray in each nare twice daily or every 12 hours.  This medication works best when it is used regularly, not "as needed".  You may find that it takes a few days for this to reach full effectiveness, please be patient with his onboarding process.  The most common side effect of this medication is nosebleeds.  Please discontinue use for 1 week if this occurs, then resume.   If symptoms have not meaningfully improved in the next 5 to 7 days, please return for repeat evaluation or follow-up with your regular provider.  If symptoms have worsened in the next 3 to 5 days, please return for repeat evaluation or follow-up with your regular provider.    Thank you for visiting urgent care today.  We appreciate the opportunity to participate in your care.       This office note has been dictated using Teaching laboratory technician.  Unfortunately, this method of dictation can sometimes lead to typographical or grammatical errors.  I apologize for your inconvenience in advance if this occurs.  Please do not hesitate to reach out to me if clarification is needed.

## 2022-10-10 NOTE — ED Triage Notes (Signed)
Pt presents with bilateral ear fullness for past few days. 

## 2023-01-25 IMAGING — DX DG CHEST 2V
2 series · 2 of 2 positions shown · non-contrast
Comparison: 05/16/2016

CLINICAL DATA: Shortness of breath, post COVID

EXAM:
CHEST - 2 VIEW

[chest pa]
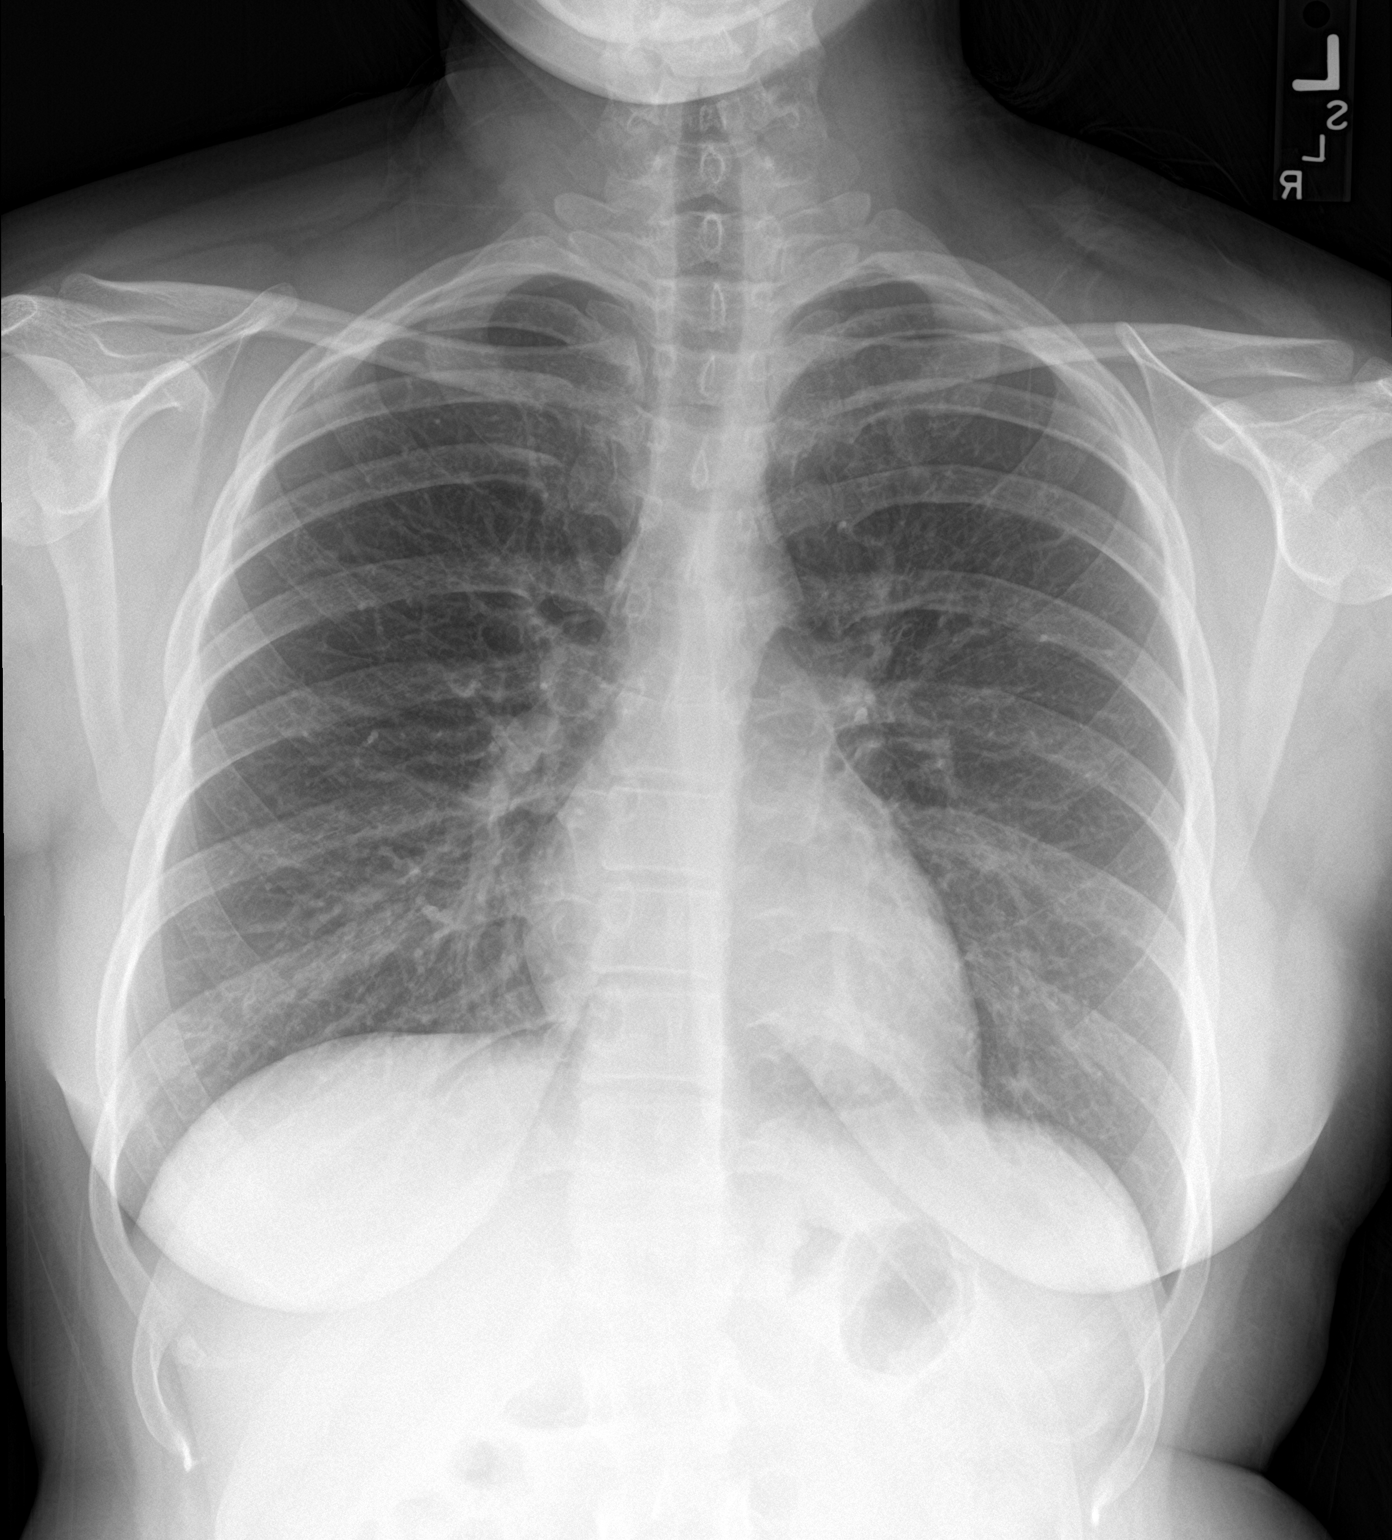

[chest lat]
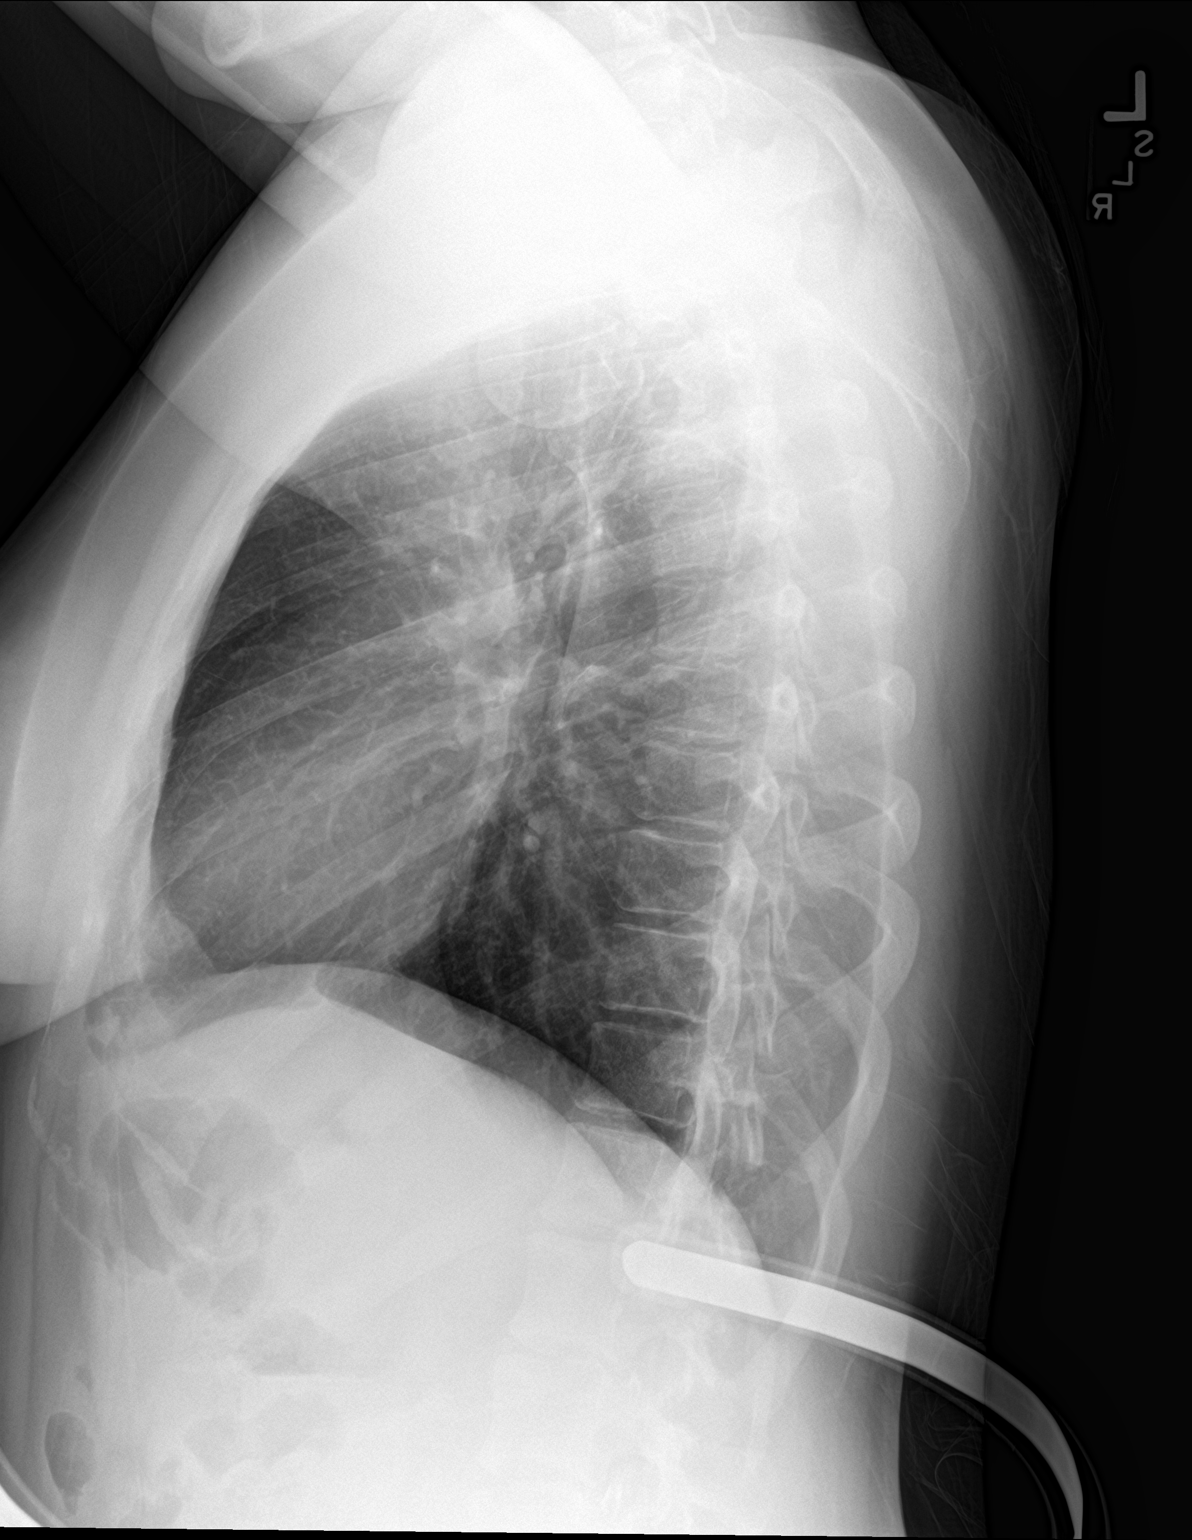

[2 of 2 positions shown; findings below may reference images not displayed]

FINDINGS: The heart size and mediastinal contours are within normal limits.
Both lungs are clear. The visualized skeletal structures are
unremarkable.
IMPRESSION: No acute abnormality of the lungs.

## 2023-02-09 ENCOUNTER — Other Ambulatory Visit: Payer: Self-pay

## 2023-02-09 ENCOUNTER — Emergency Department (HOSPITAL_BASED_OUTPATIENT_CLINIC_OR_DEPARTMENT_OTHER)
Admission: EM | Admit: 2023-02-09 | Discharge: 2023-02-09 | Disposition: A | Discharge status: Home / Self Care | Payer: Medicaid Other | Attending: Emergency Medicine | Admitting: Emergency Medicine

## 2023-02-09 ENCOUNTER — Other Ambulatory Visit (HOSPITAL_BASED_OUTPATIENT_CLINIC_OR_DEPARTMENT_OTHER): Payer: Self-pay

## 2023-02-09 ENCOUNTER — Encounter (HOSPITAL_BASED_OUTPATIENT_CLINIC_OR_DEPARTMENT_OTHER): Payer: Self-pay | Admitting: Emergency Medicine

## 2023-02-09 ENCOUNTER — Emergency Department (HOSPITAL_BASED_OUTPATIENT_CLINIC_OR_DEPARTMENT_OTHER): Payer: Medicaid Other

## 2023-02-09 DIAGNOSIS — R109 Unspecified abdominal pain: Secondary | ICD-10-CM | POA: Diagnosis present

## 2023-02-09 DIAGNOSIS — N139 Obstructive and reflux uropathy, unspecified: Secondary | ICD-10-CM | POA: Diagnosis not present

## 2023-02-09 DIAGNOSIS — Z87891 Personal history of nicotine dependence: Secondary | ICD-10-CM | POA: Diagnosis not present

## 2023-02-09 LAB — CBC WITH DIFFERENTIAL/PLATELET
Abs Immature Granulocytes: 0.02 10*3/uL (ref 0.00–0.07)
Basophils Absolute: 0 10*3/uL (ref 0.0–0.1)
Basophils Relative: 0 %
Eosinophils Absolute: 0.1 10*3/uL (ref 0.0–0.5)
Eosinophils Relative: 1 %
HCT: 39 % (ref 36.0–46.0)
Hemoglobin: 13.3 g/dL (ref 12.0–15.0)
Immature Granulocytes: 0 %
Lymphocytes Relative: 25 %
Lymphs Abs: 2.3 10*3/uL (ref 0.7–4.0)
MCH: 31.1 pg (ref 26.0–34.0)
MCHC: 34.1 g/dL (ref 30.0–36.0)
MCV: 91.1 fL (ref 80.0–100.0)
Monocytes Absolute: 0.7 10*3/uL (ref 0.1–1.0)
Monocytes Relative: 7 %
Neutro Abs: 6.1 10*3/uL (ref 1.7–7.7)
Neutrophils Relative %: 67 %
Platelets: 297 10*3/uL (ref 150–400)
RBC: 4.28 MIL/uL (ref 3.87–5.11)
RDW: 11.5 % (ref 11.5–15.5)
WBC: 9.3 10*3/uL (ref 4.0–10.5)
nRBC: 0 % (ref 0.0–0.2)

## 2023-02-09 LAB — URINALYSIS, ROUTINE W REFLEX MICROSCOPIC
Bacteria, UA: NONE SEEN
Bilirubin Urine: NEGATIVE
Glucose, UA: NEGATIVE mg/dL
Ketones, ur: NEGATIVE mg/dL
Nitrite: NEGATIVE
Protein, ur: 30 mg/dL — AB
Specific Gravity, Urine: 1.025 (ref 1.005–1.030)
pH: 5.5 (ref 5.0–8.0)

## 2023-02-09 LAB — COMPREHENSIVE METABOLIC PANEL
ALT: 25 U/L (ref 0–44)
AST: 27 U/L (ref 15–41)
Albumin: 4.3 g/dL (ref 3.5–5.0)
Alkaline Phosphatase: 72 U/L (ref 38–126)
Anion gap: 8 (ref 5–15)
BUN: 12 mg/dL (ref 6–20)
CO2: 26 mmol/L (ref 22–32)
Calcium: 9.3 mg/dL (ref 8.9–10.3)
Chloride: 103 mmol/L (ref 98–111)
Creatinine, Ser: 0.84 mg/dL (ref 0.44–1.00)
GFR, Estimated: >60 mL/min (ref >60)
Glucose, Bld: 144 mg/dL — ABNORMAL HIGH (ref 70–99)
Potassium: 3.9 mmol/L (ref 3.5–5.1)
Sodium: 137 mmol/L (ref 135–145)
Total Bilirubin: 0.3 mg/dL (ref 0.3–1.2)
Total Protein: 7.1 g/dL (ref 6.5–8.1)

## 2023-02-09 LAB — LIPASE, BLOOD: Lipase: 23 U/L (ref 11–51)

## 2023-02-09 LAB — PREGNANCY, URINE: Preg Test, Ur: NEGATIVE

## 2023-02-09 MED ORDER — IBUPROFEN 600 MG PO TABS
600.0000 mg | ORAL_TABLET | Freq: Four times a day (QID) | ORAL | 0 refills | Status: DC | PRN
  Filled 2023-02-09: qty 30, 8d supply, fill #0

## 2023-02-09 MED ORDER — OXYCODONE HCL 5 MG PO TABS
5.0000 mg | ORAL_TABLET | ORAL | 0 refills | Status: AC | PRN
  Filled 2023-02-09: qty 10, 2d supply, fill #0

## 2023-02-09 MED ORDER — SODIUM CHLORIDE 0.9 % IV BOLUS
1000.0000 mL | Freq: Once | INTRAVENOUS | Status: AC
  Administered 2023-02-09: 1000 mL via INTRAVENOUS

## 2023-02-09 MED ORDER — MORPHINE SULFATE (PF) 4 MG/ML IV SOLN
4.0000 mg | Freq: Once | INTRAVENOUS | Status: AC
  Administered 2023-02-09: 4 mg via INTRAVENOUS
  Filled 2023-02-09: qty 1

## 2023-02-09 MED ORDER — ONDANSETRON HCL 4 MG/2ML IJ SOLN
4.0000 mg | Freq: Once | INTRAMUSCULAR | Status: AC
  Administered 2023-02-09: 4 mg via INTRAVENOUS
  Filled 2023-02-09: qty 2

## 2023-02-09 MED ORDER — TAMSULOSIN HCL 0.4 MG PO CAPS
0.4000 mg | ORAL_CAPSULE | Freq: Every day | ORAL | 0 refills | Status: AC
  Filled 2023-02-09: qty 14, 14d supply, fill #0

## 2023-02-09 MED ORDER — ACETAMINOPHEN 325 MG PO TABS
650.0000 mg | ORAL_TABLET | Freq: Four times a day (QID) | ORAL | 0 refills | Status: AC | PRN
  Filled 2023-02-09: qty 36, 5d supply, fill #0

## 2023-02-09 MED ORDER — ONDANSETRON HCL 4 MG PO TABS
4.0000 mg | ORAL_TABLET | ORAL | 0 refills | Status: AC | PRN
  Filled 2023-02-09: qty 5, 1d supply, fill #0

## 2023-02-09 NOTE — Discharge Instructions (Addendum)
It was a pleasure caring for you today in the emergency department.  Please follow up with urology  Please return if symptoms worsen or you develop severe abdominal pain, fever, vomiting, worsening / worrisome symptoms

## 2023-02-09 NOTE — ED Triage Notes (Signed)
Pt arrives to ED with c/o left sided flank pain x3 days.

## 2023-02-09 NOTE — ED Provider Notes (Signed)
[place] EMERGENCY DEPARTMENT AT [facility] Provider Note  CSN: [id number] Arrival date & time: 02/09/23 0745  Chief Complaint(s) Flank Pain  HPI Kelli Hunter is a 22 y.o. female with past medical history as below, significant for nephrolithiasis who presents to the ED with complaint of flank pain L, concern for kidney stone.  Hx mx episodes of nephrolithiasis in the past, has not seen urology. Left flank pain x3 days w/ radiation to llq, nausea this morning, no fever. +reduced UOP and some mild dysuria. No chills. No cp or dib, no rash. No abnormal bleeding/discharge reported.   Past Medical History History reviewed. No pertinent past medical history. There are no problems to display for this patient.  Home Medication(s) Prior to Admission medications   Medication Sig Start Date End Date Taking? Authorizing Provider  acetaminophen (TYLENOL) 325 MG tablet Take 2 tablets (650 mg total) by mouth every 6 (six) hours as needed. 02/09/23  Yes Tanda Rockers A, DO  ibuprofen (ADVIL) 600 MG tablet Take 1 tablet (600 mg total) by mouth every 6 (six) hours as needed. 02/09/23  Yes Tanda Rockers A, DO  ondansetron (ZOFRAN) 4 MG tablet Take 1 tablet (4 mg total) by mouth every 4 (four) hours as needed for nausea or vomiting. 02/09/23  Yes Tanda Rockers A, DO  oxyCODONE (ROXICODONE) 5 MG immediate release tablet Take 1 tablet (5 mg total) by mouth every 4 (four) hours as needed for severe pain. 02/09/23  Yes Tanda Rockers A, DO  tamsulosin (FLOMAX) 0.4 MG CAPS capsule Take 1 capsule (0.4 mg total) by mouth daily for 14 days. 02/09/23 02/23/23 Yes Sloan Leiter, DO  Azelastine-Fluticasone (DYMISTA) 137-50 MCG/ACT SUSP Place 1 spray into the nose every 12 (twelve) hours. 10/10/22 11/09/22  Theadora Rama Scales, PA-C  cetirizine (ZYRTEC ALLERGY) 10 MG tablet Take 1 tablet (10 mg total) by mouth at bedtime. 10/10/22 04/08/23  Theadora Rama Scales, PA-C  VIENVA 0.1-20 MG-MCG tablet Take 1 tablet by mouth  daily. 07/21/22   [provider]                                                                                                                                    Past Surgical History History reviewed. No pertinent surgical history. Family History Family History  Family history unknown: Yes    Social History Social History   Tobacco Use   Smoking status: Former   Smokeless tobacco: Never  Advertising account planner   Vaping status: Some Days   Substances: Nicotine  Substance Use Topics   Alcohol use: No    Alcohol/week: 0.0 standard drinks of alcohol   Drug use: Yes    Types: Marijuana   Allergies Patient has no known allergies.  Review of Systems Review of Systems  Constitutional:  Negative for chills and fever.  HENT:  Negative for congestion and trouble swallowing.   Respiratory:  Negative for chest tightness and  shortness of breath.   Cardiovascular:  Negative for chest pain, palpitations and leg swelling.  Gastrointestinal:  Positive for abdominal pain and nausea. Negative for vomiting.  Genitourinary:  Positive for flank pain.  Musculoskeletal:  Negative for back pain.  Skin:  Negative for rash and wound.  Neurological:  Negative for syncope.  All other systems reviewed and are negative.   Physical Exam Vital Signs  I have reviewed the triage vital signs Ht 5\' 2"  (1.575 m)   Wt 72.6 kg   BMI 29.26 kg/m  Physical Exam Vitals and nursing note reviewed.  Constitutional:      General: She is not in acute distress.    Appearance: Normal appearance.  HENT:     Head: Normocephalic and atraumatic.     Right Ear: External ear normal.     Left Ear: External ear normal.     Nose: Nose normal.     Mouth/Throat:     Mouth: Mucous membranes are moist.  Eyes:     General: No scleral icterus.       Right eye: No discharge.        Left eye: No discharge.  Cardiovascular:     Rate and Rhythm: Normal rate and regular rhythm.     Pulses: Normal pulses.     Heart  sounds: Normal heart sounds.  Pulmonary:     Effort: Pulmonary effort is normal. No respiratory distress.     Breath sounds: Normal breath sounds. No stridor.  Abdominal:     General: Abdomen is flat. There is no distension.     Palpations: Abdomen is soft.     Tenderness: There is abdominal tenderness. There is no guarding or rebound.    Musculoskeletal:     Cervical back: No rigidity.     Right lower leg: No edema.     Left lower leg: No edema.  Skin:    General: Skin is warm and dry.     Capillary Refill: Capillary refill takes less than 2 seconds.  Neurological:     Mental Status: She is alert.  Psychiatric:        Mood and Affect: Mood normal.        Behavior: Behavior normal. Behavior is cooperative.     ED Results and Treatments Labs (all labs ordered are listed, but only abnormal results are displayed) Labs Reviewed  URINALYSIS, ROUTINE W REFLEX MICROSCOPIC - Abnormal; Notable for the following components:      Result Value   Hgb urine dipstick MODERATE (*)    Protein, ur 30 (*)    Leukocytes,Ua TRACE (*)    All other components within normal limits  COMPREHENSIVE METABOLIC PANEL - Abnormal; Notable for the following components:   Glucose, Bld 144 (*)    All other components within normal limits  PREGNANCY, URINE  CBC WITH DIFFERENTIAL/PLATELET  LIPASE, BLOOD  Radiology CT Renal Stone Study  Result Date: 02/09/2023 CLINICAL DATA:  22 year old female with abdomen and flank pain radiating to the left lower quadrant. History of previous ureteral calculi. EXAM: CT ABDOMEN AND PELVIS WITHOUT CONTRAST TECHNIQUE: Multidetector CT imaging of the abdomen and pelvis was performed following the standard protocol without IV contrast. RADIATION DOSE REDUCTION: This exam was performed according to the departmental dose-optimization program which includes  automated exposure control, adjustment of the mA and/or kV according to patient size and/or use of iterative reconstruction technique. COMPARISON:  CT Abdomen and Pelvis 07/29/2022 and earlier. FINDINGS: Lower chest: Negative. Hepatobiliary: Negative. Pancreas: Negative. Spleen: Negative. Adrenals/Urinary Tract: Normal adrenal glands. Punctate right midpole nephrolithiasis. Resolved right hydronephrosis and hydroureter seen in January. Left hydronephrosis and hydroureter are mild to moderate. The left ureter remains dilated into the pelvis, and at the left ureterovesical junction a small round 2 mm calculus is identified (coronal image 43 and series 2, image 69. No additional calculus in the left kidney. Otherwise unremarkable bladder. Stomach/Bowel: Negative. Normal appendix on series 2, image 51. No free air or free fluid. Vascular/Lymphatic: Normal caliber abdominal aorta. No lymphadenopathy identified. Reproductive: Negative. Other: No pelvis free fluid. Musculoskeletal: Negative. IMPRESSION: 1. Acute obstructive uropathy on the Left due to a 2 mm calculus at the left UVJ. 2. Punctate right nephrolithiasis. Electronically Signed   By: Odessa Fleming M.D.   On: 02/09/2023 08:45    Pertinent labs & imaging results that were available during my care of the patient were reviewed by me and considered in my medical decision making (see MDM for details).  Medications Ordered in ED Medications  sodium chloride 0.9 % bolus 1,000 mL (1,000 mLs Intravenous New Bag/Given 02/09/23 0838)  ondansetron (ZOFRAN) injection 4 mg (4 mg Intravenous Given 02/09/23 0824)  morphine (PF) 4 MG/ML injection 4 mg (4 mg Intravenous Given 02/09/23 4098)                                                                                                                                     Procedures Procedures  (including critical care time)  Medical Decision Making / ED Course    Medical Decision Making:    Tenly Trolinger is a 22 y.o.  female with past medical history as below, significant for nephrolithiasis who presents to the ED with complaint of flank pain L, concern for kidney stone. . The complaint involves an extensive differential diagnosis and also carries with it a high risk of complications and morbidity.  Serious etiology was considered. Ddx includes but is not limited to: Differential diagnosis includes but is not exclusive to ectopic pregnancy, ovarian cyst, ovarian torsion, acute appendicitis, urinary tract infection, endometriosis, bowel obstruction, hernia, colitis, renal colic, gastroenteritis, volvulus, kidney stone, pyelo etc.   Complete initial physical exam performed, notably the patient  was NAD, sitting upright ,steady gait, non peritoneal.    Reviewed and confirmed nursing documentation for  past medical history, family history, social history.  Vital signs reviewed.    Clinical Course as of 02/09/23 0853  Mon Feb 09, 2023  0825 Bacteria, UA: NONE SEEN [SG]  0848 CT renal "IMPRESSION: 1. Acute obstructive uropathy on the Left due to a 2 mm calculus at the left UVJ. 2. Punctate right nephrolithiasis.  " [SG]  0848 Creatinine: 0.84 [SG]    Clinical Course User Index [SG] Sloan Leiter, DO    Narrative: 22 year old female history of prior kidney stone here with concern for kidney stone Exam is reassuring, HDS Collect screening labs, get CT imaging, meds/ivf Labs stable as above Imaging c/w obstructive uropathy She is tolerating PO, not septic, no UTI. Reasonable to trial o/p management and have her f/u with urology  Given pcp follow up instructions as well  The patient improved significantly and was discharged in stable condition. Detailed discussions were had with the patient regarding current findings, and need for close f/u with PCP or on call doctor. The patient has been instructed to return immediately if the symptoms worsen in any way for re-evaluation. Patient verbalized understanding  and is in agreement with current care plan. All questions answered prior to discharge.                 Additional history obtained: -Additional history obtained from na -External records from outside source obtained and reviewed including: Chart review including previous notes, labs, imaging, consultation notes including  Prior ed eval Pdmp Prior labs/imaging   Lab Tests: -I ordered, reviewed, and interpreted labs.   The pertinent results include:   Labs Reviewed  URINALYSIS, ROUTINE W REFLEX MICROSCOPIC - Abnormal; Notable for the following components:      Result Value   Hgb urine dipstick MODERATE (*)    Protein, ur 30 (*)    Leukocytes,Ua TRACE (*)    All other components within normal limits  COMPREHENSIVE METABOLIC PANEL - Abnormal; Notable for the following components:   Glucose, Bld 144 (*)    All other components within normal limits  PREGNANCY, URINE  CBC WITH DIFFERENTIAL/PLATELET  LIPASE, BLOOD    Notable for labs stable  EKG   EKG Interpretation Date/Time:    Ventricular Rate:    PR Interval:    QRS Duration:    QT Interval:    QTC Calculation:   R Axis:      Text Interpretation:           Imaging Studies ordered: I ordered imaging studies including CT renal I independently visualized the following imaging with scope of interpretation limited to determining acute life threatening conditions related to emergency care; findings noted above, significant for obstructive uropathy  I independently visualized and interpreted imaging. I agree with the radiologist interpretation   Medicines ordered and prescription drug management: Meds ordered this encounter  Medications   sodium chloride 0.9 % bolus 1,000 mL   ondansetron (ZOFRAN) injection 4 mg   morphine (PF) 4 MG/ML injection 4 mg   tamsulosin (FLOMAX) 0.4 MG CAPS capsule    Sig: Take 1 capsule (0.4 mg total) by mouth daily for 14 days.    Dispense:  14 capsule    Refill:  0    ondansetron (ZOFRAN) 4 MG tablet    Sig: Take 1 tablet (4 mg total) by mouth every 4 (four) hours as needed for nausea or vomiting.    Dispense:  5 tablet    Refill:  0   acetaminophen (TYLENOL) 325 MG tablet  Sig: Take 2 tablets (650 mg total) by mouth every 6 (six) hours as needed.    Dispense:  36 tablet    Refill:  0   ibuprofen (ADVIL) 600 MG tablet    Sig: Take 1 tablet (600 mg total) by mouth every 6 (six) hours as needed.    Dispense:  30 tablet    Refill:  0   oxyCODONE (ROXICODONE) 5 MG immediate release tablet    Sig: Take 1 tablet (5 mg total) by mouth every 4 (four) hours as needed for severe pain.    Dispense:  10 tablet    Refill:  0    -I have reviewed the patients home medicines and have made adjustments as needed   Consultations Obtained: na   Cardiac Monitoring: Continuous pulse oximetry interpreted by myself, 99% on RA.    Social Determinants of Health:  Diagnosis or treatment significantly limited by social determinants of health: no pcp, former smoker    Reevaluation: After the interventions noted above, I reevaluated the patient and found that they have improved  Co morbidities that complicate the patient evaluation History reviewed. No pertinent past medical history.    Dispostion: Disposition decision including need for hospitalization was considered, and patient discharged from emergency department.    Final Clinical Impression(s) / ED Diagnoses Final diagnoses:  Obstructive uropathy

## 2023-06-06 ENCOUNTER — Emergency Department (HOSPITAL_BASED_OUTPATIENT_CLINIC_OR_DEPARTMENT_OTHER)
Admission: EM | Admit: 2023-06-06 | Discharge: 2023-06-06 | Disposition: A | Discharge status: Home / Self Care | Payer: Medicaid Other | Attending: Emergency Medicine | Admitting: Emergency Medicine

## 2023-06-06 ENCOUNTER — Encounter (HOSPITAL_BASED_OUTPATIENT_CLINIC_OR_DEPARTMENT_OTHER): Payer: Self-pay

## 2023-06-06 ENCOUNTER — Other Ambulatory Visit: Payer: Self-pay

## 2023-06-06 DIAGNOSIS — M545 Low back pain, unspecified: Secondary | ICD-10-CM | POA: Insufficient documentation

## 2023-06-06 DIAGNOSIS — R110 Nausea: Secondary | ICD-10-CM | POA: Insufficient documentation

## 2023-06-06 DIAGNOSIS — R109 Unspecified abdominal pain: Secondary | ICD-10-CM | POA: Diagnosis present

## 2023-06-06 DIAGNOSIS — R300 Dysuria: Secondary | ICD-10-CM | POA: Diagnosis not present

## 2023-06-06 DIAGNOSIS — Z87442 Personal history of urinary calculi: Secondary | ICD-10-CM | POA: Diagnosis not present

## 2023-06-06 LAB — URINALYSIS, ROUTINE W REFLEX MICROSCOPIC
Bilirubin Urine: NEGATIVE
Glucose, UA: NEGATIVE mg/dL
Hgb urine dipstick: NEGATIVE
Ketones, ur: NEGATIVE mg/dL
Leukocytes,Ua: NEGATIVE
Nitrite: NEGATIVE
Protein, ur: NEGATIVE mg/dL
Specific Gravity, Urine: 1.013 (ref 1.005–1.030)
pH: 5.5 (ref 5.0–8.0)

## 2023-06-06 LAB — PREGNANCY, URINE: Preg Test, Ur: NEGATIVE

## 2023-06-06 MED ORDER — IBUPROFEN 600 MG PO TABS: 600.0000 mg | ORAL_TABLET | Freq: Four times a day (QID) | ORAL | 0 refills | Status: AC | PRN

## 2023-06-06 MED ORDER — KETOROLAC TROMETHAMINE 30 MG/ML IJ SOLN
30.0000 mg | Freq: Once | INTRAMUSCULAR | Status: AC
  Administered 2023-06-06: 30 mg via INTRAVENOUS
  Filled 2023-06-06: qty 1

## 2023-06-06 NOTE — ED Triage Notes (Signed)
Pt came in with complaint of bilateral flank pain x 1 week. Worse on right side. Pt reports pain is achy but has intermittent intense sharp pain that feels similar to when she's had kidney stones in the past. Pt admits to nausea. Denies vomiting and fever. Pt states she has burning when she urinates and feels like she's not emptying completely.

## 2023-06-06 NOTE — ED Triage Notes (Signed)
Pt states she took oxycodone about 1-2 hrs ago at home and that it has helped some with the pain.

## 2023-06-06 NOTE — ED Provider Notes (Signed)
EMERGENCY DEPARTMENT AT [facility]  Provider Note  CSN: [id number] Arrival date & time: 06/06/23 0355  History Chief Complaint  Patient presents with   Flank Pain    Kelli Hunter is a 22 y.o. female with history of kidney stones reports several days of bilateral R>L low back pain, intermittent, some worsening with movement and associated with some dysuria and hesitancy. She has not had a fever. Some nausea but no vomiting. Last had CT in Aug 2024 for left sided ureterolithiasis, noted to have punctate R sided stones then. She has had 4 CT scans since Jul 2023 for stones.    Home Medications Prior to Admission medications   Medication Sig Start Date End Date Taking? Authorizing Provider  acetaminophen (TYLENOL) 325 MG tablet Take 2 tablets (650 mg total) by mouth every 6 (six) hours as needed. 02/09/23   Sloan Leiter, DO  Azelastine-Fluticasone (DYMISTA) 137-50 MCG/ACT SUSP Place 1 spray into the nose every 12 (twelve) hours. 10/10/22 11/09/22  Theadora Rama Scales, PA-C  cetirizine (ZYRTEC ALLERGY) 10 MG tablet Take 1 tablet (10 mg total) by mouth at bedtime. 10/10/22 04/08/23  Theadora Rama Scales, PA-C  ibuprofen (ADVIL) 600 MG tablet Take 1 tablet (600 mg total) by mouth every 6 (six) hours as needed. 06/06/23   Pollyann Savoy, MD  ondansetron (ZOFRAN) 4 MG tablet Take 1 tablet (4 mg total) by mouth every 4 (four) hours as needed for nausea or vomiting. 02/09/23   Tanda Rockers A, DO  oxyCODONE (ROXICODONE) 5 MG immediate release tablet Take 1 tablet (5 mg total) by mouth every 4 (four) hours as needed for severe pain. 02/09/23   Tanda Rockers A, DO  VIENVA 0.1-20 MG-MCG tablet Take 1 tablet by mouth daily. 07/21/22   [provider]     Allergies    Patient has no known allergies.   Review of Systems   Review of Systems Please see HPI for pertinent positives and negatives  Physical Exam BP (!) 138/99 (BP Location: Right Arm)   Pulse (!) 101    Temp 98.1 F (36.7 C) (Oral)   Resp 18   Ht 5\' 1"  (1.549 m)   Wt 77.1 kg   SpO2 96%   BMI 32.12 kg/m   Physical Exam Vitals and nursing note reviewed.  Constitutional:      Appearance: Normal appearance.  HENT:     Head: Normocephalic and atraumatic.     Nose: Nose normal.     Mouth/Throat:     Mouth: Mucous membranes are moist.  Eyes:     Extraocular Movements: Extraocular movements intact.     Conjunctiva/sclera: Conjunctivae normal.  Cardiovascular:     Rate and Rhythm: Normal rate.  Pulmonary:     Effort: Pulmonary effort is normal.     Breath sounds: Normal breath sounds.  Abdominal:     General: Abdomen is flat.     Palpations: Abdomen is soft.     Tenderness: There is no abdominal tenderness. There is no right CVA tenderness, left CVA tenderness or guarding.  Musculoskeletal:        General: Tenderness (mild lumbar, R>L) present. No swelling. Normal range of motion.     Cervical back: Neck supple.  Skin:    General: Skin is warm and dry.  Neurological:     General: No focal deficit present.     Mental Status: She is alert.  Psychiatric:        Mood and Affect: Mood  normal.     ED Results / Procedures / Treatments   EKG None  Procedures Procedures  Medications Ordered in the ED Medications  ketorolac (TORADOL) 30 MG/ML injection 30 mg (30 mg Intravenous Given 06/06/23 0444)    Initial Impression and Plan  Patient with known renal stones, here with low back pain and some urinary symptoms. May be MSK vs UTI vs renal colic. She is relatively comfortable at the time of my evaluation, took some left over oxycodone before coming in. Will check UA, avoid additional CT imaging given multiple recent scans and low concern for large stone.   ED Course   Clinical Course as of 06/06/23 0538  Sat Jun 06, 2023  0440 UA and HCG are both normal. Toradol ordered for pain.  [CS]  [phone] Patient reports pain is improved after Toradol, suspect this is more MSK pain, but  given her history will provide her with a urine strainer. Rx for NSAIDs, recommend outpatient Urology follow up. RTED if pain is uncontrolled, fever, vomiting or any other concerns.  [CS]    Clinical Course User Index [CS] Pollyann Savoy, MD     MDM Rules/Calculators/A&P Medical Decision Making Problems Addressed: Flank pain: acute illness or injury  Amount and/or Complexity of Data Reviewed Labs: ordered. Decision-making details documented in ED Course.  Risk Prescription drug management.     Final Clinical Impression(s) / ED Diagnoses Final diagnoses:  Flank pain    Rx / DC Orders ED Discharge Orders          Ordered    ibuprofen (ADVIL) 600 MG tablet  Every 6 hours PRN        06/06/23 0538             Pollyann Savoy, MD 06/06/23 [phone]

## 2023-11-24 ENCOUNTER — Emergency Department (HOSPITAL_BASED_OUTPATIENT_CLINIC_OR_DEPARTMENT_OTHER)

## 2023-11-24 ENCOUNTER — Emergency Department (HOSPITAL_BASED_OUTPATIENT_CLINIC_OR_DEPARTMENT_OTHER)
Admission: EM | Admit: 2023-11-24 | Discharge: 2023-11-24 | Disposition: A | Discharge status: Home / Self Care | Payer: Worker's Compensation | Attending: Emergency Medicine | Admitting: Emergency Medicine

## 2023-11-24 DIAGNOSIS — F17210 Nicotine dependence, cigarettes, uncomplicated: Secondary | ICD-10-CM | POA: Diagnosis not present

## 2023-11-24 DIAGNOSIS — S51852A Open bite of left forearm, initial encounter: Secondary | ICD-10-CM | POA: Insufficient documentation

## 2023-11-24 DIAGNOSIS — Z23 Encounter for immunization: Secondary | ICD-10-CM | POA: Insufficient documentation

## 2023-11-24 DIAGNOSIS — W540XXA Bitten by dog, initial encounter: Secondary | ICD-10-CM | POA: Diagnosis not present

## 2023-11-24 MED ORDER — AMOXICILLIN-POT CLAVULANATE 875-125 MG PO TABS
1.0000 | ORAL_TABLET | Freq: Once | ORAL | Status: AC
  Administered 2023-11-24: 1 via ORAL
  Filled 2023-11-24: qty 1

## 2023-11-24 MED ORDER — BACITRACIN ZINC 500 UNIT/GM EX OINT
TOPICAL_OINTMENT | Freq: Once | CUTANEOUS | Status: AC
  Administered 2023-11-24: 31.5 via TOPICAL
  Filled 2023-11-24: qty 28.35

## 2023-11-24 MED ORDER — BACITRACIN ZINC 500 UNIT/GM EX OINT: 1.0000 | TOPICAL_OINTMENT | Freq: Two times a day (BID) | CUTANEOUS | 0 refills | Status: AC

## 2023-11-24 MED ORDER — TETANUS-DIPHTH-ACELL PERTUSSIS 5-2.5-18.5 LF-MCG/0.5 IM SUSY
0.5000 mL | PREFILLED_SYRINGE | Freq: Once | INTRAMUSCULAR | Status: AC
  Administered 2023-11-24: 0.5 mL via INTRAMUSCULAR
  Filled 2023-11-24: qty 0.5

## 2023-11-24 MED ORDER — AMOXICILLIN-POT CLAVULANATE 875-125 MG PO TABS: 1.0000 | ORAL_TABLET | Freq: Two times a day (BID) | ORAL | 0 refills | Status: AC

## 2023-11-24 NOTE — ED Notes (Signed)
 Medications administered. Wound cleaned and dressing placed with bacitracin.

## 2023-11-24 NOTE — ED Triage Notes (Signed)
 Dog bite to left arm today. Dog fully vaccinated. Bleeding controlled.

## 2023-11-24 NOTE — ED Provider Notes (Signed)
 [facility] EMERGENCY DEPARTMENT AT [facility] Provider Note   CSN: [id number] Arrival date & time: 11/24/23  1653     History  Chief Complaint  Patient presents with   Animal Bite    Kelli Hunter is a 23 y.o. female.   Animal Bite   23 year old presents emergency department with complaint of dog bite.  Works at a [employer] and was taking care of a poodle when it bit her on the left forearm.  Reports puncture wound to the back of her left forearm and also a scratch on the front of her left forearm.  Patient unknown of her last Tdap.  States that the dog was up-to-date on rabies vaccinations.  Denies any pain/injury elsewhere.  Presents emergency department for further assessment/evaluation.  No significant pertinent past medical history.  Home Medications Prior to Admission medications   Medication Sig Start Date End Date Taking? Authorizing Provider  amoxicillin -clavulanate (AUGMENTIN ) 875-125 MG tablet Take 1 tablet by mouth every 12 (twelve) hours. 11/24/23  Yes Neil Balls A, PA  bacitracin  ointment Apply 1 Application topically 2 (two) times daily. 11/24/23  Yes Neil Balls A, PA  acetaminophen  (TYLENOL ) 325 MG tablet Take 2 tablets (650 mg total) by mouth every 6 (six) hours as needed. 02/09/23   Teddi Favors, DO  Azelastine -Fluticasone  (DYMISTA ) 137-50 MCG/ACT SUSP Place 1 spray into the nose every 12 (twelve) hours. 10/10/22 11/09/22  Eloise Hake Scales, PA-C  cetirizine  (ZYRTEC  ALLERGY) 10 MG tablet Take 1 tablet (10 mg total) by mouth at bedtime. 10/10/22 04/08/23  Eloise Hake Scales, PA-C  ibuprofen  (ADVIL ) 600 MG tablet Take 1 tablet (600 mg total) by mouth every 6 (six) hours as needed. 06/06/23   Charmayne Iyana Topor, MD  ondansetron  (ZOFRAN ) 4 MG tablet Take 1 tablet (4 mg total) by mouth every 4 (four) hours as needed for nausea or vomiting. 02/09/23   Russella Courts A, DO  oxyCODONE  (ROXICODONE ) 5 MG immediate release tablet Take 1 tablet  (5 mg total) by mouth every 4 (four) hours as needed for severe pain. 02/09/23   Russella Courts A, DO  VIENVA 0.1-20 MG-MCG tablet Take 1 tablet by mouth daily. 07/21/22   [provider]      Allergies    Patient has no known allergies.    Review of Systems   Review of Systems  All other systems reviewed and are negative.   Physical Exam Updated Vital Signs BP 115/88 (BP Location: Right Arm)   Pulse 79   Temp 98.6 F (37 C) (Oral)   Resp 16   SpO2 100%  Physical Exam Vitals and nursing note reviewed.  Constitutional:      General: She is not in acute distress.    Appearance: She is well-developed.  HENT:     Head: Normocephalic and atraumatic.  Eyes:     Conjunctiva/sclera: Conjunctivae normal.  Cardiovascular:     Rate and Rhythm: Normal rate and regular rhythm.     Heart sounds: No murmur heard. Pulmonary:     Effort: Pulmonary effort is normal. No respiratory distress.     Breath sounds: Normal breath sounds.  Abdominal:     Palpations: Abdomen is soft.     Tenderness: There is no abdominal tenderness.  Musculoskeletal:        General: No swelling.     Cervical back: Neck supple.     Comments: Full range of motion left elbow, left wrist, digits.  Muscular strength 5-5 upper extremities.  Radial pulses 2+ bilaterally.  Patient with 1.0 cm very superficial laceration on the dorsal lateral aspect of forearm.  Superficial abrasion appreciated on the anterior aspect of forearm measuring 5.2 cm.  Surrounding ecchymosis of both areas.  No obvious bony tenderness of radius/ulna.  Skin:    General: Skin is warm and dry.     Capillary Refill: Capillary refill takes less than 2 seconds.  Neurological:     Mental Status: She is alert.  Psychiatric:        Mood and Affect: Mood normal.     ED Results / Procedures / Treatments   Labs (all labs ordered are listed, but only abnormal results are displayed) Labs Reviewed - No data to display  EKG None  Radiology DG  Forearm Left Result Date: 11/24/2023 CLINICAL DATA:  Dog bite EXAM: LEFT FOREARM - 2 VIEW COMPARISON:  None Available. FINDINGS: No acute fracture or dislocation. There is no evidence of arthropathy or other focal bone abnormality. Soft tissues are unremarkable. No radiopaque foreign body. IMPRESSION: No acute fracture or dislocation. Electronically Signed   By: Rance Burrows M.D.   On: 11/24/2023 18:37    Procedures Procedures    Medications Ordered in ED Medications  Tdap (BOOSTRIX) injection 0.5 mL (0.5 mLs Intramuscular Given 11/24/23 1825)  bacitracin ointment (31.5 Applications Topical Given 11/24/23 1825)  amoxicillin -clavulanate (AUGMENTIN ) 875-125 MG per tablet 1 tablet (1 tablet Oral Given 11/24/23 1825)    ED Course/ Medical Decision Making/ A&P                                 Medical Decision Making Amount and/or Complexity of Data Reviewed Radiology: ordered.  Risk OTC drugs. Prescription drug management.   This patient presents to the ED for concern of dog bite, this involves an extensive number of treatment options, and is a complaint that carries with it a high risk of complications and morbidity.  The differential diagnosis includes fracture, strain/pain, dislocation, foreign body retainment, ligament/tendinous injury, compartment syndrome, other   Co morbidities that complicate the patient evaluation  See HPI   Additional history obtained:  Additional history obtained from EMR External records from outside source obtained and reviewed including hospital records   Lab Tests:  N/a   Imaging Studies ordered:  I ordered imaging studies including left forearm x-ray I independently visualized and interpreted imaging which showed no acute osseous abnormality I agree with the radiologist interpretation   Cardiac Monitoring: / EKG:  N/a   Consultations Obtained:  N/a   Problem List / ED Course / Critical interventions / Medication  management  Dog bite I ordered medication including bacitracin, Augmentin , Tdap   Reevaluation of the patient after these medicines showed that the patient stayed the same I have reviewed the patients home medicines and have made adjustments as needed   Social Determinants of Health:  Former cigarette use.  Denies illicit drug use.   Test / Admission - Considered:  Dog bite Vitals signs  within normal range and stable throughout visit. Imaging studies significant for: See above  68 year old presents emergency department with complaint of dog bite.  Works at a [employer] and was taking care of a poodle when it bit her on the left forearm.  Reports puncture wound to the back of her left forearm and also a scratch on the front of her left forearm.  Patient unknown of her last Tdap.  States  that the dog was up-to-date on rabies vaccinations.  Denies any pain/injury elsewhere.  Presents emergency department for further assessment/evaluation. On exam, superficial laceration appreciated on the dorsal aspect left forearm as well as abrasion appreciated in the anterior aspect of left forearm with surrounding ecchymosis.  No obvious bony tenderness.  X-ray obtained by triage staff which was negative.  No evidence clinically of compartment syndrome or neurovascular compromise.  Will recommend local wound care at home and will place patient on antibiotics empirically to prevent infection in the form of Augmentin .  Will recommend follow-up with PCP for reevaluation.  Treatment plan discussed with patient and she acknowledged understanding was agreeable to said plan.  Patient well-appearing, afebrile in no acute distress. Worrisome signs and symptoms were discussed with the patient, and the patient acknowledged understanding to return to the ED if noticed. Patient was stable upon discharge.          Final Clinical Impression(s) / ED Diagnoses Final diagnoses:  Dog bite, initial encounter     Rx / DC Orders ED Discharge Orders          Ordered    amoxicillin -clavulanate (AUGMENTIN ) 875-125 MG tablet  Every 12 hours        11/24/23 1830    bacitracin ointment  2 times daily        11/24/23 1830               Butter, [place] 11/24/23 1846    Lowery Rue, DO 11/24/23 2152

## 2023-11-24 NOTE — Discharge Instructions (Addendum)
 As discussed, continue take antibiotic as prescribed.  Wash area gently with warm soapy water and place antibiotic remainder for wounds.  Recommend at least daily bandage changes.  You may take Tylenol /ibuprofen  for pain.  Please do not hesitate to return if you develop the worrisome signs and symptoms we discussed.

## 2024-01-20 ENCOUNTER — Encounter (HOSPITAL_COMMUNITY): Payer: Self-pay | Admitting: *Deleted

## 2024-01-20 ENCOUNTER — Other Ambulatory Visit: Payer: Self-pay

## 2024-01-20 ENCOUNTER — Ambulatory Visit (HOSPITAL_COMMUNITY): Admission: EM | Admit: 2024-01-20 | Discharge: 2024-01-20 | Disposition: A | Discharge status: Home / Self Care

## 2024-01-20 DIAGNOSIS — R3911 Hesitancy of micturition: Secondary | ICD-10-CM

## 2024-01-20 DIAGNOSIS — Z3202 Encounter for pregnancy test, result negative: Secondary | ICD-10-CM | POA: Diagnosis not present

## 2024-01-20 LAB — POCT URINALYSIS DIP (MANUAL ENTRY)
Bilirubin, UA: NEGATIVE
Blood, UA: NEGATIVE
Glucose, UA: NEGATIVE mg/dL
Ketones, POC UA: NEGATIVE mg/dL
Leukocytes, UA: NEGATIVE
Nitrite, UA: NEGATIVE
Protein Ur, POC: NEGATIVE mg/dL
Spec Grav, UA: 1.01 (ref 1.010–1.025)
Urobilinogen, UA: 0.2 U/dL
pH, UA: 6 (ref 5.0–8.0)

## 2024-01-20 LAB — POCT URINE PREGNANCY: Preg Test, Ur: NEGATIVE

## 2024-01-20 NOTE — ED Triage Notes (Signed)
 PT reports back pain ,Ha and urgency. PT did a home test that was positive nitrates.

## 2024-01-20 NOTE — ED Provider Notes (Signed)
 [facility]  Note:  This document was prepared using Dragon voice recognition software and may include unintentional dictation errors.  MRN: [id number] DOB: 04-Sep-2000  Subjective:   Kelli Hunter is a 23 y.o. female presenting for Urinary urgency, headache, back pain x 2 days.  Patient reports taking a home test that was positive for nitrites.  Patient denies any dysuria, vaginal discharge, severe abdominal pain or flank pain.  Patient reports past history of urinary tract infection.  No shortness of breath, chest pain, weakness, dizziness.  No current facility-administered medications for this encounter.  Current Outpatient Medications:    acetaminophen  (TYLENOL ) 325 MG tablet, Take 2 tablets (650 mg total) by mouth every 6 (six) hours as needed., Disp: 36 tablet, Rfl: 0   amoxicillin -clavulanate (AUGMENTIN ) 875-125 MG tablet, Take 1 tablet by mouth every 12 (twelve) hours., Disp: 12 tablet, Rfl: 0   Azelastine -Fluticasone  (DYMISTA ) 137-50 MCG/ACT SUSP, Place 1 spray into the nose every 12 (twelve) hours., Disp: 23 g, Rfl: 2   bacitracin  ointment, Apply 1 Application topically 2 (two) times daily., Disp: 120 g, Rfl: 0   cetirizine  (ZYRTEC  ALLERGY) 10 MG tablet, Take 1 tablet (10 mg total) by mouth at bedtime., Disp: 90 tablet, Rfl: 1   ibuprofen  (ADVIL ) 600 MG tablet, Take 1 tablet (600 mg total) by mouth every 6 (six) hours as needed., Disp: 30 tablet, Rfl: 0   ondansetron  (ZOFRAN ) 4 MG tablet, Take 1 tablet (4 mg total) by mouth every 4 (four) hours as needed for nausea or vomiting., Disp: 5 tablet, Rfl: 0   oxyCODONE  (ROXICODONE ) 5 MG immediate release tablet, Take 1 tablet (5 mg total) by mouth every 4 (four) hours as needed for severe pain., Disp: 10 tablet, Rfl: 0   VIENVA 0.1-20 MG-MCG tablet, Take 1 tablet by mouth daily., Disp: , Rfl:    No Known Allergies  History reviewed. No pertinent past medical history.   History reviewed. No pertinent surgical  history.  Family History  Family history unknown: Yes    Social History   Tobacco Use   Smoking status: Former   Smokeless tobacco: Never  Advertising account planner   Vaping status: Some Days   Substances: Nicotine  Substance Use Topics   Alcohol use: No    Alcohol/week: 0.0 standard drinks of alcohol   Drug use: Yes    Types: Marijuana    ROS Refer to HPI for ROS details.  Objective:   Vitals: BP 118/78   Pulse 83   Temp 97.8 F (36.6 C)   Resp 20   LMP 12/26/2023 (Approximate)   SpO2 98%   Physical Exam Vitals and nursing note reviewed.  Constitutional:      General: She is not in acute distress.    Appearance: Normal appearance. She is well-developed. She is not ill-appearing or toxic-appearing.  HENT:     Head: Normocephalic and atraumatic.  Cardiovascular:     Rate and Rhythm: Normal rate.  Pulmonary:     Effort: Pulmonary effort is normal. No respiratory distress.  Abdominal:     General: There is no distension.     Palpations: Abdomen is soft.     Tenderness: There is no abdominal tenderness. There is no right CVA tenderness or left CVA tenderness.  Skin:    General: Skin is warm and dry.  Neurological:     General: No focal deficit present.     Mental Status: She is alert and oriented to person, place, and time.  Psychiatric:  Mood and Affect: Mood normal.        Behavior: Behavior normal.     Procedures  Results for orders placed or performed during the hospital encounter of 01/20/24 (from the past 24 hours)  POC urinalysis dipstick     Status: None   Collection Time: 01/20/24  7:10 PM  Result Value Ref Range   Color, UA yellow yellow   Clarity, UA clear clear   Glucose, UA negative negative mg/dL   Bilirubin, UA negative negative   Ketones, POC UA negative negative mg/dL   Spec Grav, UA 8.989 8.989 - 1.025   Blood, UA negative negative   pH, UA 6.0 5.0 - 8.0   Protein Ur, POC negative negative mg/dL   Urobilinogen, UA 0.2 0.2 or 1.0 E.U./dL    Nitrite, UA Negative Negative   Leukocytes, UA Negative Negative  POCT urine pregnancy     Status: None   Collection Time: 01/20/24  7:14 PM  Result Value Ref Range   Preg Test, Ur Negative Negative    No results found.   Assessment and Plan :     Discharge Instructions       1. Urinary hesitancy (Primary) - POC urinalysis dipstick urinalysis shows no leukocytes, no nitrite, no blood, no sign of urinary tract infection or renal lithiasis - POCT urine pregnancy completed in UC is negative. - Continue to monitor symptoms for any changes severity if there is any escalation of your current symptoms or development of new symptoms follow-up for further evaluation and management.      Alma Muegge B Yanisa Goodgame   Jaleena Viviani, [place] B, [place] 01/20/24 1936

## 2024-01-20 NOTE — Discharge Instructions (Signed)
  1. Urinary hesitancy (Primary) - POC urinalysis dipstick urinalysis shows no leukocytes, no nitrite, no blood, no sign of urinary tract infection or renal lithiasis - POCT urine pregnancy completed in UC is negative. - Continue to monitor symptoms for any changes severity if there is any escalation of your current symptoms or development of new symptoms follow-up for further evaluation and management.

## 2024-01-21 ENCOUNTER — Ambulatory Visit (HOSPITAL_COMMUNITY)

## 2024-05-02 ENCOUNTER — Other Ambulatory Visit: Payer: Self-pay

## 2024-05-02 ENCOUNTER — Encounter (HOSPITAL_BASED_OUTPATIENT_CLINIC_OR_DEPARTMENT_OTHER): Payer: Self-pay | Admitting: Emergency Medicine

## 2024-05-02 DIAGNOSIS — Z87891 Personal history of nicotine dependence: Secondary | ICD-10-CM | POA: Insufficient documentation

## 2024-05-02 DIAGNOSIS — W260XXA Contact with knife, initial encounter: Secondary | ICD-10-CM | POA: Insufficient documentation

## 2024-05-02 DIAGNOSIS — S61210A Laceration without foreign body of right index finger without damage to nail, initial encounter: Secondary | ICD-10-CM | POA: Insufficient documentation

## 2024-05-02 DIAGNOSIS — S6991XA Unspecified injury of right wrist, hand and finger(s), initial encounter: Secondary | ICD-10-CM | POA: Diagnosis present

## 2024-05-02 NOTE — ED Triage Notes (Addendum)
 Dropped knife on right pointer finger. Tetanus UTD. 1cm lac to knuckle. No bleeding in triage. Antibiotic applied and gauze wrapped around.

## 2024-05-03 ENCOUNTER — Emergency Department (HOSPITAL_BASED_OUTPATIENT_CLINIC_OR_DEPARTMENT_OTHER)
Admission: EM | Admit: 2024-05-03 | Discharge: 2024-05-03 | Disposition: A | Discharge status: Home / Self Care | Attending: Emergency Medicine | Admitting: Emergency Medicine

## 2024-05-03 DIAGNOSIS — S61210A Laceration without foreign body of right index finger without damage to nail, initial encounter: Secondary | ICD-10-CM

## 2024-05-03 NOTE — ED Provider Notes (Signed)
 [facility] Emergency Department Provider Note MRN:  [id number]  Arrival date & time: 05/03/24     Chief Complaint   Laceration   History of Present Illness   Kelli Hunter is a 23 y.o. year-old female with no pertinent past medical history presenting to the ED with chief complaint of laceration.  Laceration to the right pointer finger, dropped a knife on it.  Lots of bleeding at home, here for evaluation.  No other injuries.  Review of Systems  A thorough review of systems was obtained and all systems are negative except as noted in the HPI and PMH.   Patient's Health History   History reviewed. No pertinent past medical history.  History reviewed. No pertinent surgical history.  Family History  Family history unknown: Yes    Social History   Socioeconomic History   Marital status: Single    Spouse name: Not on file   Number of children: Not on file   Years of education: Not on file   Highest education level: Not on file  Occupational History   Not on file  Tobacco Use   Smoking status: Former   Smokeless tobacco: Never  Vaping Use   Vaping status: Some Days   Substances: Nicotine  Substance and Sexual Activity   Alcohol use: No    Alcohol/week: 0.0 standard drinks of alcohol   Drug use: Yes    Types: Marijuana   Sexual activity: Never  Other Topics Concern   Not on file  Social History Narrative   Not on file   Social Drivers of Health   Financial Resource Strain: Not on file  Food Insecurity: Not on file  Transportation Needs: Not on file  Physical Activity: Not on file  Stress: Not on file  Social Connections: Not on file  Intimate Partner Violence: Not on file     Physical Exam   Vitals:   05/02/24 2250  BP: 114/78  Pulse: 73  Resp: 16  Temp: 98.1 F (36.7 C)  SpO2: 99%    CONSTITUTIONAL: Well-appearing, NAD NEURO/PSYCH:  Alert and oriented x 3, no focal deficits EYES:  eyes equal and reactive ENT/NECK:  no LAD,  no JVD CARDIO: Regular rate, well-perfused, normal S1 and S2 PULM:  CTAB no wheezing or rhonchi GI/GU:  non-distended, non-tender MSK/SPINE:  No gross deformities, no edema SKIN:  no rash   *Additional and/or pertinent findings included in MDM below  Diagnostic and Interventional Summary    EKG Interpretation Date/Time:    Ventricular Rate:    PR Interval:    QRS Duration:    QT Interval:    QTC Calculation:   R Axis:      Text Interpretation:         Labs Reviewed - No data to display  No orders to display    Medications - No data to display   Procedures  /  Critical Care .Laceration Repair  Date/Time: 05/03/2024 2:08 AM  Performed by: Theadore Ozell HERO, MD Authorized by: Theadore Ozell HERO, MD   Consent:    Consent obtained:  Verbal   Consent given by:  Patient   Risks, benefits, and alternatives were discussed: yes     Risks discussed:  Infection, need for additional repair, nerve damage, poor wound healing, poor cosmetic result, pain, retained foreign body, tendon damage and vascular damage   Alternatives discussed:  No treatment Universal protocol:    Procedure explained and questions answered to patient or proxy's satisfaction: yes  Patient identity confirmed:  Verbally with patient Anesthesia:    Anesthesia method:  None Laceration details:    Location:  Finger   Finger location:  R index finger   Length (cm):  1   Depth (mm):  2 Pre-procedure details:    Preparation:  Patient was prepped and draped in usual sterile fashion Exploration:    Limited defect created (wound extended): no     Hemostasis achieved with:  Direct pressure   Wound exploration: wound explored through full range of motion and entire depth of wound visualized     Contaminated: no   Treatment:    Area cleansed with:  Saline   Amount of cleaning:  Standard Skin repair:    Repair method:  Tissue adhesive Approximation:    Approximation:  Close Repair type:    Repair type:   Simple Post-procedure details:    Procedure completion:  Tolerated well, no immediate complications   ED Course and Medical Decision Making  Initial Impression and Ddx 1 cm laceration to the dorsal aspect of the right pointer finger at the PIP.  Good candidate for Dermabond.  Superficial, neurovascularly intact, normal extensor ability.  Past medical/surgical history that increases complexity of ED encounter: None  Interpretation of Diagnostics Laboratory and/or imaging options to aid in the diagnosis/care of the patient were considered.  After careful history and physical examination, it was determined that there was no indication for diagnostics at this time.  Patient Reassessment and Ultimate Disposition/Management     Tetanus is up-to-date.  Discharge  Patient management required discussion with the following services or consulting groups:  None  Complexity of Problems Addressed Acute complicated illness or Injury  Additional Data Reviewed and Analyzed Further history obtained from: Further history from spouse/family member  Additional Factors Impacting ED Encounter Risk None  Ozell HERO. Theadore, MD [facility] Emergency Medicine [facility] [email or web address]  Final Clinical Impressions(s) / ED Diagnoses     ICD-10-CM   1. Laceration of right index finger without foreign body without damage to nail, initial encounter  D38.789J       ED Discharge Orders     None        Discharge Instructions Discussed with and Provided to Patient:     Discharge Instructions      You were evaluated in the Emergency Department and after careful evaluation, we did not find any emergent condition requiring admission or further testing in the hospital.  Your exam/testing today was overall reassuring.  We repaired your laceration here in the emergency department.  Try to keep the area dry for the next few days.  Please return to the Emergency Department if you  experience any worsening of your condition.  Thank you for allowing us  to be a part of your care.        Theadore Ozell HERO, MD 05/03/24 [phone]

## 2024-05-03 NOTE — Discharge Instructions (Signed)
 You were evaluated in the Emergency Department and after careful evaluation, we did not find any emergent condition requiring admission or further testing in the hospital.  Your exam/testing today was overall reassuring.  We repaired your laceration here in the emergency department.  Try to keep the area dry for the next few days.  Please return to the Emergency Department if you experience any worsening of your condition.  Thank you for allowing us  to be a part of your care.

## 2024-07-01 ENCOUNTER — Telehealth
